# Patient Record
Sex: Female | Born: 1975 | Race: Black or African American | Hispanic: No | Smoking: Current every day smoker
Health system: Southern US, Community
[De-identification: ages and names within clinical notes are randomized; demographics above are authoritative.]

## PROBLEM LIST (undated history)

## (undated) DIAGNOSIS — I499 Cardiac arrhythmia, unspecified: Secondary | ICD-10-CM

## (undated) DIAGNOSIS — G4730 Sleep apnea, unspecified: Secondary | ICD-10-CM

## (undated) DIAGNOSIS — K219 Gastro-esophageal reflux disease without esophagitis: Secondary | ICD-10-CM

## (undated) DIAGNOSIS — Z9109 Other allergy status, other than to drugs and biological substances: Secondary | ICD-10-CM

## (undated) DIAGNOSIS — I10 Essential (primary) hypertension: Secondary | ICD-10-CM

## (undated) DIAGNOSIS — G43909 Migraine, unspecified, not intractable, without status migrainosus: Secondary | ICD-10-CM

## (undated) HISTORY — DX: Migraine, unspecified, not intractable, without status migrainosus: G43.909

## (undated) HISTORY — DX: Gastro-esophageal reflux disease without esophagitis: K21.9

## (undated) HISTORY — DX: Cardiac arrhythmia, unspecified: I49.9

## (undated) HISTORY — DX: Sleep apnea, unspecified: G47.30

## (undated) HISTORY — DX: Other allergy status, other than to drugs and biological substances: Z91.09

---

## 2003-09-25 DIAGNOSIS — K219 Gastro-esophageal reflux disease without esophagitis: Secondary | ICD-10-CM

## 2003-09-25 HISTORY — DX: Gastro-esophageal reflux disease without esophagitis: K21.9

## 2007-09-25 HISTORY — PX: TUBAL LIGATION: SHX77

## 2010-09-24 HISTORY — PX: THYROID SURGERY: SHX805

## 2017-09-24 DIAGNOSIS — G4730 Sleep apnea, unspecified: Secondary | ICD-10-CM

## 2017-09-24 DIAGNOSIS — G43909 Migraine, unspecified, not intractable, without status migrainosus: Secondary | ICD-10-CM

## 2017-09-24 DIAGNOSIS — Z9109 Other allergy status, other than to drugs and biological substances: Secondary | ICD-10-CM

## 2017-09-24 DIAGNOSIS — I499 Cardiac arrhythmia, unspecified: Secondary | ICD-10-CM

## 2017-09-24 HISTORY — DX: Sleep apnea, unspecified: G47.30

## 2017-09-24 HISTORY — DX: Cardiac arrhythmia, unspecified: I49.9

## 2017-09-24 HISTORY — DX: Other allergy status, other than to drugs and biological substances: Z91.09

## 2017-09-24 HISTORY — DX: Migraine, unspecified, not intractable, without status migrainosus: G43.909

## 2017-11-06 ENCOUNTER — Ambulatory Visit (INDEPENDENT_AMBULATORY_CARE_PROVIDER_SITE_OTHER): Payer: PRIVATE HEALTH INSURANCE

## 2017-11-06 ENCOUNTER — Ambulatory Visit (HOSPITAL_COMMUNITY)
Admission: EM | Admit: 2017-11-06 | Discharge: 2017-11-06 | Disposition: A | Discharge status: Home / Self Care | Payer: PRIVATE HEALTH INSURANCE | Attending: Family Medicine | Admitting: Family Medicine

## 2017-11-06 ENCOUNTER — Encounter (HOSPITAL_COMMUNITY): Payer: Self-pay | Admitting: Emergency Medicine

## 2017-11-06 ENCOUNTER — Other Ambulatory Visit: Payer: Self-pay

## 2017-11-06 DIAGNOSIS — M791 Myalgia, unspecified site: Secondary | ICD-10-CM | POA: Diagnosis not present

## 2017-11-06 DIAGNOSIS — Z79899 Other long term (current) drug therapy: Secondary | ICD-10-CM | POA: Insufficient documentation

## 2017-11-06 DIAGNOSIS — I517 Cardiomegaly: Secondary | ICD-10-CM | POA: Diagnosis not present

## 2017-11-06 DIAGNOSIS — R05 Cough: Secondary | ICD-10-CM

## 2017-11-06 DIAGNOSIS — I10 Essential (primary) hypertension: Secondary | ICD-10-CM | POA: Insufficient documentation

## 2017-11-06 DIAGNOSIS — F17200 Nicotine dependence, unspecified, uncomplicated: Secondary | ICD-10-CM | POA: Diagnosis not present

## 2017-11-06 DIAGNOSIS — R51 Headache: Secondary | ICD-10-CM

## 2017-11-06 DIAGNOSIS — J111 Influenza due to unidentified influenza virus with other respiratory manifestations: Secondary | ICD-10-CM | POA: Insufficient documentation

## 2017-11-06 DIAGNOSIS — R6883 Chills (without fever): Secondary | ICD-10-CM

## 2017-11-06 DIAGNOSIS — J029 Acute pharyngitis, unspecified: Secondary | ICD-10-CM | POA: Diagnosis not present

## 2017-11-06 DIAGNOSIS — R69 Illness, unspecified: Secondary | ICD-10-CM

## 2017-11-06 HISTORY — DX: Essential (primary) hypertension: I10

## 2017-11-06 LAB — POCT RAPID STREP A: Streptococcus, Group A Screen (Direct): NEGATIVE

## 2017-11-06 MED ORDER — OSELTAMIVIR PHOSPHATE 75 MG PO CAPS: 75.0000 mg | ORAL_CAPSULE | Freq: Two times a day (BID) | ORAL | 0 refills | Status: AC

## 2017-11-06 MED ORDER — BENZONATATE 200 MG PO CAPS: 200.0000 mg | ORAL_CAPSULE | Freq: Three times a day (TID) | ORAL | 0 refills | Status: AC

## 2017-11-06 MED ORDER — ONDANSETRON 4 MG PO TBDP: 4.0000 mg | ORAL_TABLET | Freq: Three times a day (TID) | ORAL | 0 refills | Status: DC | PRN

## 2017-11-06 NOTE — ED Provider Notes (Signed)
[facility]    CSN: [id number] Arrival date & time: 11/06/17  1146     History   Chief Complaint Chief Complaint  Patient presents with  . URI    HPI Anne Gonzalez is a 42 y.o. female history of hypertension, presenting today with acute onset of cough, body aches, headache, chills.  Symptoms all began yesterday.  She took a Tylenol cold and flu last night.  She denies nausea, vomiting, abdominal pain.  She does state that her stomach does feel a little off as if she needs to have diarrhea.  Patient has approximately 14-year pack year history.  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  Not have a flu shot this year.  Patient does not have a primary care provider, she is not on any medicines for her blood pressure.  She recently moved to the area and has not gotten established yet.  States she just recently got insurance with work.  She denies headache being the worst headache of life, states she has she does feel like she has had more difficulty seeing at night, but no acute changes.  Denies any chest pain or shortness of breath.  HPI  Past Medical History:  Diagnosis Date  . Hypertension     There are no active problems to display for this patient.   Past Surgical History:  Procedure Laterality Date  . THYROID SURGERY    . TUBAL LIGATION      OB History    No data available       Home Medications    Prior to Admission medications   Medication Sig Start Date End Date Taking? Authorizing Provider  benzonatate (TESSALON) 200 MG capsule Take 1 capsule (200 mg total) by mouth every 8 (eight) hours for 7 days. 11/06/17 11/13/17  Wieters, Hallie C, PA-C  ondansetron (ZOFRAN ODT) 4 MG disintegrating tablet Take 1 tablet (4 mg total) by mouth every 8 (eight) hours as needed for nausea or vomiting. 11/06/17   Wieters, Hallie C, PA-C  oseltamivir (TAMIFLU) 75 MG capsule Take 1 capsule (75 mg total) by mouth 2 (two) times daily for  5 days. 11/06/17 11/11/17  Wieters, Junius CreamerHallie C, PA-C    Family History Family History  Problem Relation Age of Onset  . Diabetes Father   . Hypertension Father   . Heart failure Father     Social History Social History   Tobacco Use  . Smoking status: Current Every Day Smoker  Substance Use Topics  . Alcohol use: Yes  . Drug use: No     Allergies   Lisinopril   Review of Systems Review of Systems  Constitutional: Negative for chills, fatigue and fever.  HENT: Positive for congestion, rhinorrhea, sinus pressure and sore throat. Negative for ear pain and trouble swallowing.   Eyes: Positive for visual disturbance.  Respiratory: Positive for cough. Negative for chest tightness and shortness of breath.   Cardiovascular: Negative for chest pain.  Gastrointestinal: Negative for abdominal pain, nausea and vomiting.  Musculoskeletal: Negative for myalgias.  Skin: Negative for rash.  Neurological: Positive for headaches. Negative for dizziness and light-headedness.     Physical Exam Triage Vital Signs ED Triage Vitals  Enc Vitals Group     BP 11/06/17 1302 (!) 182/104     Pulse Rate 11/06/17 1302 87     Resp 11/06/17 1302 (!) 22     Temp 11/06/17 1302 100.2 F (37.9 C)  Temp Source 11/06/17 1302 Oral     SpO2 11/06/17 1302 100 %     Weight --      Height --      Head Circumference --      Peak Flow --      Pain Score 11/06/17 1258 4     Pain Loc --      Pain Edu? --      Excl. in GC? --    No data found.  Updated Vital Signs BP (!) 178/105 (BP Location: Left Arm)   Pulse 87   Temp 100.2 F (37.9 C) (Oral)   Resp (!) 22   LMP 10/31/2017 (Exact Date)   SpO2 100%   Visual Acuity Right Eye Distance:   Left Eye Distance:   Bilateral Distance:    Right Eye Near:   Left Eye Near:    Bilateral Near:     Physical Exam  Constitutional: She is oriented to person, place, and time. She appears well-developed and well-nourished. No distress.  Patient lying  under a blanket in room.  HENT:  Head: Normocephalic and atraumatic.  Bilateral TMs nonerythematous, nasal mucosa and turbinates erythematous with rhinorrhea present, posterior oropharynx erythematous, no exudate or tonsillar enlargement.  Eyes: Conjunctivae and EOM are normal. Pupils are equal, round, and reactive to light.  Neck: Neck supple.  Cardiovascular: Normal rate and regular rhythm.  No murmur heard. Pulmonary/Chest: Effort normal and breath sounds normal. No respiratory distress.  Breathing comfortably at rest, mild rhonchi auscultated in right upper lung field, clear to auscultation without adventitious sounds in remaining lung fields.  Abdominal: Soft. There is no tenderness.  Musculoskeletal: She exhibits no edema.  Neurological: She is alert and oriented to person, place, and time.  Skin: Skin is warm and dry.  Psychiatric: She has a normal mood and affect.  Nursing note and vitals reviewed.    UC Treatments / Results  Labs (all labs ordered are listed, but only abnormal results are displayed) Labs Reviewed  CULTURE, GROUP A STREP [facility])  POCT RAPID STREP A    EKG  EKG Interpretation None       Radiology Dg Chest 2 View  Result Date: 11/06/2017 CLINICAL DATA:  Intermittent cough for 2 days. EXAM: CHEST  2 VIEW COMPARISON:  None. FINDINGS: Lungs are clear. There is cardiomegaly. No pneumothorax or pleural effusion. No acute bony abnormality. IMPRESSION: Cardiomegaly without acute disease. Electronically Signed   By: Drusilla Kanner M.D.   On: 11/06/2017 13:38    Procedures Procedures (including critical care time)  Medications Ordered in UC Medications - No data to display   Initial Impression / Assessment and Plan / UC Course  I have reviewed the triage vital signs and the nursing notes.  Pertinent labs & imaging results that were available during my care of the patient were reviewed by me and considered in my medical decision making (see chart for  details).     Patient with symptoms concerning for influenza, obtain chest x-ray given mild fever, history of smoking and mild rhonchi.  Chest x-ray negative for pneumonia.  Will treat for influenza.  Patient within 48 hours of Tamiflu window, will give Tamiflu and Zofran.  Recommend further over-the-counter symptom management.  Tessalon for cough. Discussed strict return precautions. Patient verbalized understanding and is agreeable with plan.   Final Clinical Impressions(s) / UC Diagnoses   Final diagnoses:  Influenza-like illness    ED Discharge Orders        Ordered  oseltamivir (TAMIFLU) 75 MG capsule  2 times daily     11/06/17 1351    ondansetron (ZOFRAN ODT) 4 MG disintegrating tablet  Every 8 hours PRN     11/06/17 1351    benzonatate (TESSALON) 200 MG capsule  Every 8 hours     11/06/17 1351       Controlled Substance Prescriptions [place] Controlled Substance Registry consulted? Not Applicable   Lew Dawes, [place] 11/06/17 1409

## 2017-11-06 NOTE — Discharge Instructions (Signed)
You appear to have the flu.  Please take Tamiflu twice daily for the next 5 days.  He may use Zofran as needed for nausea, and to help with your appetite.  1. Take a daily allergy pill/anti-histamine like Zyrtec, Claritin, or Store brand consistently for 2 weeks  2. For congestion you may try an oral decongestant like Mucinex or sudafed. You may also try intranasal flonase nasal spray or saline irrigations (neti pot, sinus cleanse)  3. For your sore throat you may try cepacol lozenges, salt water gargles, throat spray. Treatment of congestion may also help your sore throat.  4. For cough you may try Tessalon that I have prescribed, or you may try over-the-counter Delsym or Robitussin.  5. Take Tylenol or Ibuprofen to help with pain/inflammation-please alternate every 4 hours to control fever, headache  6. Stay hydrated, drink plenty of fluids to keep throat coated and less irritated  Honey Tea For cough/sore throat try using a honey-based tea. Use 3 teaspoons of honey with juice squeezed from half lemon. Place shaved pieces of ginger into 1/2-1 cup of water and warm over stove top. Then mix the ingredients and repeat every 4 hours as needed.  Please continue to monitor your blood pressure at home, if you develop any severe headache, changes in vision, one-sided weakness, difficulty speaking, chest pain or shortness of breath please go to the emergency room.

## 2017-11-06 NOTE — ED Triage Notes (Signed)
Onset yesterday of symptoms.  Coughing started yesterday.  Today, she feels terrible: coughing, sneezing, generalized pain.  Patient works at a nursing home.

## 2017-11-08 LAB — CULTURE, GROUP A STREP (THRC)

## 2017-11-18 ENCOUNTER — Encounter (HOSPITAL_COMMUNITY): Payer: Self-pay

## 2017-11-18 ENCOUNTER — Ambulatory Visit (HOSPITAL_COMMUNITY)
Admission: EM | Admit: 2017-11-18 | Discharge: 2017-11-18 | Disposition: A | Discharge status: Home / Self Care | Payer: PRIVATE HEALTH INSURANCE | Attending: Family Medicine | Admitting: Family Medicine

## 2017-11-18 DIAGNOSIS — M25561 Pain in right knee: Secondary | ICD-10-CM | POA: Diagnosis not present

## 2017-11-18 DIAGNOSIS — S161XXA Strain of muscle, fascia and tendon at neck level, initial encounter: Secondary | ICD-10-CM | POA: Diagnosis not present

## 2017-11-18 DIAGNOSIS — S46912A Strain of unspecified muscle, fascia and tendon at shoulder and upper arm level, left arm, initial encounter: Secondary | ICD-10-CM | POA: Diagnosis not present

## 2017-11-18 DIAGNOSIS — M25562 Pain in left knee: Secondary | ICD-10-CM

## 2017-11-18 DIAGNOSIS — I10 Essential (primary) hypertension: Secondary | ICD-10-CM

## 2017-11-18 MED ORDER — NAPROXEN 500 MG PO TABS: 500.0000 mg | ORAL_TABLET | Freq: Two times a day (BID) | ORAL | 0 refills | Status: DC | PRN

## 2017-11-18 MED ORDER — CYCLOBENZAPRINE HCL 10 MG PO TABS: ORAL_TABLET | ORAL | 0 refills | Status: DC
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
## 2017-11-18 NOTE — Discharge Instructions (Addendum)
Recommend start Naproxen 500mg  twice a day as directed for pain. May take Flexeril 10mg  1/2 to 1 whole tablet every 8 hours as needed for muscle spasms/pain. Apply warm compresses to area for comfort. Follow-up in 3 to 4 days if not improving.
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
## 2018-03-17 ENCOUNTER — Encounter: Payer: Self-pay | Admitting: Family Medicine

## 2018-04-10 ENCOUNTER — Encounter: Payer: Self-pay | Admitting: Family Medicine

## 2018-04-10 NOTE — Progress Notes (Signed)
Subjective:   Patient ID: Anne Gonzalez    DOB: 18-Sep-1976, 42 y.o. female   MRN: [id number]  Anne GrossMichelle Lynn Fickel is a 42 y.o. female here for   Establish Care - PMFSH and medications reviewed and updated in EMR - HTN - no current meds. Previously on losartan, HCTZ, lisinopril, carvedilol. Had cough on lisinopril but tolerated losartan. Was previously on statin also.  - h/o migraine - woke up last Friday with headache. Took tylenol PM. Used to take imitrex 50mg  in the past (~2016) but didn't like the way it made her feel. Sometimes still lingered on 50mg . Thought 100mg  was too strong. Hasn't had as many since she left her husband.  - allergies - pineapple, hay fever, pollen. No chronic meds, claritin PRN - irregular heart beat - was told when had thyroid surgery. Denies palpitations, SOB, CP. - sleep apnea - noticed with weight gain, previously with CPAP. No issues currently. - sometimes will get boils in vaginal area and under arms - h/o postpartum depression, previously on zoloft with good effect. Not currently on antidepressants. - Surgical Hx - BTL 2009, thyroid surgery 2012 (never been on meds) - Reproductive Hx - [id number], vaginal deliveries. Gestational Diabetes with last 2 pregnancies. BP fine during pregnancies. 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 Smokes cigarettes. Former marijuana use. Occasional alcohol use.  FATIGUE Patient complains of fatigue for the past few years. The Tiredness is described as low energy Feels like doing activities but can't: yes Does not feel like doing things: no Feels the fatigue is due to: unsure PHQ2 negative. Endorses 2 menstrual cycles per month for the past 3 months with associated low energy but has been feeling fatigued prior to menorrhagia.  Symptoms Fever: no Sweating at night: no Weight Loss: no Shortness of Breath: no Coughing up Blood: no Muscle Pain or Weakness: some back pain Black or bloody Stools: no Severe Snoring or  Daytime Sleepiness: yes. Does drink energy drinks to get through the day Feeling Down: no. Loves to cook and is like therapy to her Not enjoying things: no Rash: no Leg or Joint Swelling: no Chest Pain or irregular heart beat:  H/o irregular heart beat, denies CP  Healthcare Maintenance - Pap Smear: due  Review of Systems:  Per HPI.  PMFSH, medications and smoking status reviewed.  Objective:   BP (!) 145/90   Pulse 76   Temp 98.9 F (37.2 C) (Oral)   Ht 5' 3.5" (1.613 m)   Wt 177 lb (80.3 kg)   LMP 04/10/2018 Comment: twice a month x 3 months  SpO2 98%   BMI 30.86 kg/m  Vitals and nursing note reviewed.  General: obese female, in no acute distress with non-toxic appearance HEENT: normocephalic, atraumatic, moist mucous membranes Neck: supple, non-tender without lymphadenopathy CV: regular rate and rhythm without murmurs, rubs, or gallops, no lower extremity edema Lungs: clear to auscultation bilaterally with normal work of breathing Abdomen: soft, non-tender, no masses or organomegaly palpable, normoactive bowel sounds Extremities: warm and well perfused, normal tone MSK: ROM grossly intact, strength intact, gait normal Neuro: Alert and oriented, speech normal  Assessment & Plan:   Fatigue Likely multifactorial. Describes menorrhagia for the past few months as well as a history of sleep apnea and hypothyroidism without CPAP or synthroid use recently so anemia, sleep apnea, and hypothyroidism are likely contributors. Also, with recent major life change, stress is also likely a factor however PHQ2 negative so less concern at this moment for depression.  Will obtain CBC, TFTs, Vit B12 and Vit D levels.   Essential hypertension Endorses previous h/o HTN however without medication use recently. Previous ED visit in 10/2017 noted 180s SBP. 130s SBP today, 145/90 on recheck. Patient declines medication intervention at this time but agrees to [facility] and follow up in one week for  likely medication initiation.  Healthcare Maintenance Encouraged patient to return for pap smear. Obtained baseline labs today including A1c. Referral made to dentistry.  Orders Placed This Encounter  Procedures  . TSH  . CBC with Differential  . Vitamin B12  . T3, Free  . T4, Free  . Vitamin D, 25-hydroxy  . HIV antibody (with reflex)  . Basic Metabolic Panel  . Ambulatory referral to Dentistry    Referral Priority:   Routine    Referral Type:   Consultation    Referral Reason:   Specialty Services Required    Requested Specialty:   Dental General Practice    Number of Visits Requested:   1  . HgB A1c   No orders of the defined types were placed in this encounter.   Ellwood Dense, DO PGY-2, [facility] 04/14/2018 1:40 PM

## 2018-04-11 ENCOUNTER — Encounter: Payer: Self-pay | Admitting: Family Medicine

## 2018-04-11 ENCOUNTER — Other Ambulatory Visit: Payer: Self-pay

## 2018-04-11 ENCOUNTER — Ambulatory Visit: Payer: No Typology Code available for payment source | Admitting: Family Medicine

## 2018-04-11 VITALS — BP 145/90 | HR 76 | Temp 98.9°F | Ht 63.5 in | Wt 177.0 lb

## 2018-04-11 DIAGNOSIS — R5383 Other fatigue: Secondary | ICD-10-CM | POA: Insufficient documentation

## 2018-04-11 DIAGNOSIS — I10 Essential (primary) hypertension: Secondary | ICD-10-CM

## 2018-04-11 DIAGNOSIS — Z7689 Persons encountering health services in other specified circumstances: Secondary | ICD-10-CM

## 2018-04-11 LAB — POCT GLYCOSYLATED HEMOGLOBIN (HGB A1C): Hemoglobin A1C: 4.7 % (ref 4.0–5.6)

## 2018-04-11 NOTE — Patient Instructions (Signed)
It was great to see you!  Our plans for today:  - We are checking some labs today, we will call you with these results. - Please make an appointment for your pap smear. At that time, we can take a closer look at your boils and prescribe antibiotics if necessary. - We made a referral to a dentist.  Take care and seek immediate care sooner if you develop any concerns.   Dr. Mollie Germanyumball [facility]

## 2018-04-11 NOTE — Assessment & Plan Note (Addendum)
Likely multifactorial. Describes menorrhagia for the past few months as well as a history of sleep apnea and hypothyroidism without CPAP or synthroid use recently so anemia, sleep apnea, and hypothyroidism are likely contributors. Also, with recent major life change, stress is also likely a factor however PHQ2 negative so less concern at this moment for depression. Will obtain CBC, TFTs, Vit B12 and Vit D levels.

## 2018-04-12 LAB — CBC WITH DIFFERENTIAL/PLATELET
Basophils Absolute: 0 10*3/uL (ref 0.0–0.2)
Basos: 0 %
EOS (ABSOLUTE): 0.2 10*3/uL (ref 0.0–0.4)
Eos: 2 %
Hematocrit: 39.8 % (ref 34.0–46.6)
Hemoglobin: 13.3 g/dL (ref 11.1–15.9)
Immature Grans (Abs): 0.1 10*3/uL (ref 0.0–0.1)
Immature Granulocytes: 1 %
Lymphocytes Absolute: 2.9 10*3/uL (ref 0.7–3.1)
Lymphs: 29 %
MCH: 31.5 pg (ref 26.6–33.0)
MCHC: 33.4 g/dL (ref 31.5–35.7)
MCV: 94 fL (ref 79–97)
Monocytes Absolute: 0.7 10*3/uL (ref 0.1–0.9)
Monocytes: 7 %
Neutrophils Absolute: 6.1 10*3/uL (ref 1.4–7.0)
Neutrophils: 61 %
Platelets: 378 10*3/uL (ref 150–450)
RBC: 4.22 x10E6/uL (ref 3.77–5.28)
RDW: 11.7 % — ABNORMAL LOW (ref 12.3–15.4)
WBC: 10.1 10*3/uL (ref 3.4–10.8)

## 2018-04-12 LAB — BASIC METABOLIC PANEL
BUN/Creatinine Ratio: 13 (ref 9–23)
BUN: 11 mg/dL (ref 6–24)
CO2: 22 mmol/L (ref 20–29)
Calcium: 9.5 mg/dL (ref 8.7–10.2)
Chloride: 104 mmol/L (ref 96–106)
Creatinine, Ser: 0.83 mg/dL (ref 0.57–1.00)
GFR calc Af Amer: 99 mL/min/{1.73_m2} (ref >59)
GFR calc non Af Amer: 86 mL/min/{1.73_m2} (ref >59)
Glucose: 91 mg/dL (ref 65–99)
Potassium: 4.5 mmol/L (ref 3.5–5.2)
Sodium: 141 mmol/L (ref 134–144)

## 2018-04-12 LAB — VITAMIN D 25 HYDROXY (VIT D DEFICIENCY, FRACTURES): Vit D, 25-Hydroxy: 9.2 ng/mL — ABNORMAL LOW (ref 30.0–100.0)

## 2018-04-12 LAB — T3, FREE: T3, Free: 2.8 pg/mL (ref 2.0–4.4)

## 2018-04-12 LAB — VITAMIN B12: Vitamin B-12: 393 pg/mL (ref 232–1245)

## 2018-04-12 LAB — TSH: TSH: 2.58 u[IU]/mL (ref 0.450–4.500)

## 2018-04-12 LAB — HIV ANTIBODY (ROUTINE TESTING W REFLEX): HIV Screen 4th Generation wRfx: NONREACTIVE

## 2018-04-12 LAB — T4, FREE: Free T4: 1.37 ng/dL (ref 0.82–1.77)

## 2018-04-14 ENCOUNTER — Encounter: Payer: Self-pay | Admitting: Family Medicine

## 2018-04-14 DIAGNOSIS — I10 Essential (primary) hypertension: Secondary | ICD-10-CM | POA: Insufficient documentation

## 2018-04-14 DIAGNOSIS — I1A0 Resistant hypertension: Secondary | ICD-10-CM | POA: Insufficient documentation

## 2018-04-14 NOTE — Assessment & Plan Note (Addendum)
Endorses previous h/o HTN however without medication use recently. Previous ED visit in 10/2017 noted 180s SBP. 130s SBP today, 145/90 on recheck. Patient declines medication intervention at this time [facility] up in one week for likely medication initiation.

## 2018-04-18 ENCOUNTER — Ambulatory Visit: Payer: No Typology Code available for payment source | Admitting: Family Medicine

## 2018-04-18 ENCOUNTER — Other Ambulatory Visit: Payer: Self-pay

## 2018-04-18 ENCOUNTER — Encounter: Payer: Self-pay | Admitting: Family Medicine

## 2018-04-18 DIAGNOSIS — I10 Essential (primary) hypertension: Secondary | ICD-10-CM

## 2018-04-18 DIAGNOSIS — R5382 Chronic fatigue, unspecified: Secondary | ICD-10-CM | POA: Diagnosis not present

## 2018-04-18 MED ORDER — LOSARTAN POTASSIUM 50 MG PO TABS: 50.0000 mg | ORAL_TABLET | Freq: Every day | ORAL | 0 refills | Status: DC

## 2018-04-18 NOTE — Assessment & Plan Note (Signed)
Fatigue is likely more accurately described as anhedonia caused by a depressive episode in the setting of multiple life stressors she is currently facing. Previous labs (TSH, CBC) are normal. Sleep apnea could be a possible contributor. Gave patient the information for our behavioral health team and told her to call them whenever she wants to talk with someone. Told patient we can further evaluate her possible depressive episode during next visit and see if she still feels this way after she has had some time to spend with her best friend, who will be living with her for a while. Patient does not have any suicidal ideation.  Will discuss the need for antidepressive medication on next visit.
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
## 2018-04-18 NOTE — Patient Instructions (Addendum)
It was nice talking to you today [name].  I wanted to sum up what we talked about below:   - We talked about your blood pressure.  It was elevated again today, so we are going to start you on Losartan 50mg  daily.    - We will follow up in 1 to 2 weeks to see if how you respond to this medication.    - We talked about the stress you are dealing with in your life currently including your relationship with you husband, moving to a new place, and your stress at work and how this has affected your will to do things.  We will talk about this again when you come next time to see if any of this has changed since your friend has come down to stay with you. If it hasn't, we can talk about medication options to help.    - We discussed our behavioral health services that are available here.  You can contact them any time to schedule an appointment with them at: [phone].    - We discussed that during your next visit we can perform a Pap smear if you would like.    - We discussed getting a Tdap vaccination today if you would like.    Thank you for letting me take care of you today.    Frederic Jerichoan Trent Gabler, MD

## 2018-04-18 NOTE — Assessment & Plan Note (Signed)
Patient was 164/86 today in clinic.  Patient unsure of all the HTN meds she was on previously.  Knows lisinopril makes her cough and thinks amlodipine may have made her legs swell.  Will start her on losartan 50 qdaily and titrate as necessary.  Patient asked to return in 2 weeks for followup.

## 2018-05-06 ENCOUNTER — Encounter: Payer: Self-pay | Admitting: Family Medicine

## 2018-05-06 ENCOUNTER — Ambulatory Visit (INDEPENDENT_AMBULATORY_CARE_PROVIDER_SITE_OTHER): Payer: No Typology Code available for payment source | Admitting: Family Medicine

## 2018-05-06 ENCOUNTER — Other Ambulatory Visit: Payer: Self-pay

## 2018-05-06 VITALS — BP 152/90 | HR 81 | Temp 98.1°F | Ht 64.0 in | Wt 178.0 lb

## 2018-05-06 DIAGNOSIS — I10 Essential (primary) hypertension: Secondary | ICD-10-CM | POA: Diagnosis not present

## 2018-05-06 MED ORDER — LOSARTAN POTASSIUM 50 MG PO TABS: 50.0000 mg | ORAL_TABLET | Freq: Every day | ORAL | 0 refills | Status: DC

## 2018-05-06 MED ORDER — HYDROCHLOROTHIAZIDE 25 MG PO TABS: 25.0000 mg | ORAL_TABLET | Freq: Every day | ORAL | 3 refills | Status: DC

## 2018-05-06 NOTE — Assessment & Plan Note (Addendum)
Patient BP today is 152/90 and still elevated despite initiation of losartan 2 weeks ago. Patient is currently on 50 mg daily. Patient was concerned about fluid retention and had trace edema in her lower extremities. Patient could benefit from an Echo and seem to have had one in the past. She has a history of uncontrolled HTN for many and could have some degree of structural change to her heart and possible CHF. Will start patient on thiazide today , she will follow up with PCP in early September. If still not at goal could increase losartan to a 100 mg daily. Patient has failed amlodipine in the past because of swelling. --Start patient on Amlodipine 25 mg daily --Follow up with PCP in 2-3 weeks

## 2018-05-06 NOTE — Progress Notes (Signed)
   Subjective:    Patient ID: Ollen GrossMichelle Lynn Matlack, female    DOB: [birth year], 42 y.o.   MRN: [id number]   CC: Blood pressure follow up   HPI: Patient is a 42 yo female who presents today to follow up on elevated BP. Patient was started on losartan two weeks ago. She reports that blood pressure reading are are still elevated SBP in the 160's and DBP in the 90-100's. She is concerned she is retaining fluid. Patient reports that she had and ECHO while se was still living in [place]. Patient denies any shortness of breath, HA, chest pain, vision changes, tinnitus.   Smoking status reviewed   ROS: all other systems were reviewed and are negative other than in the HPI   Past Medical History:  Diagnosis Date  . Acid reflux 2005  . Environmental allergies 2019  . Hypertension   . Irregular heart beat 2019  . Migraine 2019  . Sleep apnea 2019    Past Surgical History:  Procedure Laterality Date  . THYROID SURGERY  2012  . TUBAL LIGATION  2009    Past medical history, surgical, family, and social history reviewed and updated in the EMR as appropriate.  Objective:  BP (!) 152/90   Pulse 81   Temp 98.1 F (36.7 C) (Oral)   Ht 5\' 4"  (1.626 m)   Wt 178 lb (80.7 kg)   LMP 04/10/2018 Comment: twice a month x 3 months  BMI 30.55 kg/m   Vitals and nursing note reviewed  General: NAD, pleasant, able to participate in exam Cardiac: RRR, normal heart sounds, no murmurs. 2+ radial and PT pulses bilaterally Respiratory: CTAB, normal effort, No wheezes, rales or rhonchi Abdomen: soft, nontender, nondistended, no hepatic or splenomegaly, +BS Extremities: no edema or cyanosis. WWP. Skin: warm and dry, no rashes noted Neuro: alert and oriented x4, no focal deficits Psych: Normal affect and mood   Assessment & Plan:    Essential hypertension Patient BP today is 152/90 and still elevated despite initiation of losartan 2 weeks ago. Patient is currently on 50 mg daily. Patient was  concerned about fluid retention and had trace edema in her lower extremities. Patient could benefit from an Echo and seem to have had one in the past. She has a history of uncontrolled HTN for many and could have some degree of structural change to her heart and possible CHF. Will start patient on thiazide today , she will follow up with PCP in early September. If still not at goal could increase losartan to a 100 mg daily. Patient has failed amlodipine in the past because of swelling. --Start patient on Amlodipine 25 mg daily --Follow up with PCP in 2-3 weeks    Lovena NeighboursAbdoulaye Chrystopher Stangl, MD [facility] PGY-3

## 2018-05-06 NOTE — Patient Instructions (Signed)
It was great seeing you today! We have addressed the following issues today  1. I am starting you on HCTZ 25 mg, continue to check you BP and discuss it with Dr.Rumball when see her on 8/16.  If we did any lab work today, and the results require attention, either me or my nurse will get in touch with you. If everything is normal, you will get a letter in mail and a message via . If you don't hear from us in two weeks, please give us a call. Otherwise, we look forward to seeing you again at your next visit. If you have any questions or concerns before then, please call the clinic at [phone].  Please bring all your medications to every doctors visit  Sign up for My Chart to have easy access to your labs results, and communication with your Primary care physician. Please ask Front Desk for some assistance.   Please check-out at the front desk before leaving the clinic.    Take Care,   Dr. Sydnee Cabaliallo

## 2018-05-30 ENCOUNTER — Encounter: Payer: No Typology Code available for payment source | Admitting: Family Medicine

## 2018-08-26 ENCOUNTER — Encounter (HOSPITAL_COMMUNITY): Payer: Self-pay | Admitting: Emergency Medicine

## 2018-08-26 ENCOUNTER — Ambulatory Visit (HOSPITAL_COMMUNITY)
Admission: EM | Admit: 2018-08-26 | Discharge: 2018-08-26 | Disposition: A | Discharge status: Home / Self Care | Payer: BLUE CROSS/BLUE SHIELD | Attending: Family Medicine | Admitting: Family Medicine

## 2018-08-26 DIAGNOSIS — T25222A Burn of second degree of left foot, initial encounter: Secondary | ICD-10-CM

## 2018-08-26 MED ORDER — MELOXICAM 7.5 MG PO TABS: 7.5000 mg | ORAL_TABLET | Freq: Every day | ORAL | 0 refills | Status: DC
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
## 2018-08-26 NOTE — Discharge Instructions (Signed)
No alarming signs on exam. Start Mobic. Do not take ibuprofen (motrin/advil)/ naproxen (aleve) while on mobic.  Ice compress, avoid friction, wearing postop boot.  Avoid tight shoes.  This should slowly go down and start healing within the next week or 2.  Follow-up your with PCP for further evaluation if symptoms not improving.  Follow-up for reevaluation if showing signs of infection such as redness, warmth, fever.
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
## 2018-10-15 NOTE — Progress Notes (Signed)
Subjective:   Patient ID: Anne Gonzalez    DOB: 03-Apr-1976, 43 y.o. female   MRN: [id number]  Anne Gonzalez is a 43 y.o. female with a history of HTN here for   Pap smear - last PAP years ago, unsure if normal. - denies abnormal discharge or bleeding - LMP 10/09/18 - BTL 2009, no other form of contraception - not sexually active, last sex 4 years  Skin rash Endorses circular dark patches on her skin located under her R breast, arm, and abdomen that have been there for about 4 years. She first noticed it when putting on lotion. She was living in [place] at the time. When they first occurred, they were red, irritated, swollen, and warm and now will occasionally "open up and then scab over." She denies any discharge. She denies any known bug, tick, or animal exposure prior to noticing it. She denies N/V, bowel movement changes, joint swelling or pain, CP, SOB. She does have a history of boils, but these look different from boils. They are currently not bothering her. She is concerned because she has mentioned these to several other doctors and have been told not to worry about it.  Hypertension: - Medications: losartan 50mg , HCTZ 25mg  daily - Compliance: stopped taking HCTZ 2 weeks after prescribed, had headaches. - had previously been on amlodipine but BP dropped too low (about 5 years ago) - Checking BP at home: went to dentist this am 181/82. Taking at home, sometimes as high as 200s SBP. 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 - Denies any SOB, CP, LE edema, medication SEs, or symptoms of hypotension - endorsing some blurry vision for the past year. Especially noticeable at night. Is due to see the eye doctor.  Review of Systems:  Per HPI.  PMFSH, medications and smoking status reviewed.  Objective:   BP (!) 154/92   Pulse 80   Temp 98.8 F (37.1 C) (Oral)   Ht 5\' 4"  (1.626 m)   Wt 180 lb 8 oz (81.9 kg)    LMP 10/09/2018   SpO2 97%   BMI 30.98 kg/m  Vitals and nursing note reviewed.  General: overweight female, in no acute distress with non-toxic appearance CV: regular rate and rhythm without murmurs, rubs, or gallops, no lower extremity edema Lungs: clear to auscultation bilaterally with normal work of breathing Skin: warm, dry. 3 noticeable dark circular patches located on R arm, under R breast, and L lower abdomen a few cm in diameter (See pictures). Extremities: warm and well perfused, normal tone MSK: ROM grossly intact, strength intact, gait normal Neuro: Alert and oriented, speech normal         Assessment & Plan:   Essential hypertension Above goal today with losartan. Will start low dose amlodipine with instructed to take BP daily and call for highs/lows (see AVS for parameters). Will obtain BMP today. F/u in 3 months if BP ok at home.  Rash and nonspecific skin eruption Unclear etiology. No meds identified to be causing drug eruption. No known animal/bug exposure. Could be hypersensitivity reaction of unknown origin. Instructed patient to take pictures of when it "opens up" to better classify. Offered hydrocortisone cream to help with hyperpigmentation, patient will obtain OTC.   Healthcare Maintenance Pap smear obtained today, will call/send letter with results.   Orders Placed This Encounter  Procedures  . Basic Metabolic Panel   Meds ordered  this encounter  Medications  . amLODipine (NORVASC) 5 MG tablet    Sig: Take 1 tablet (5 mg total) by mouth daily.    Dispense:  90 tablet    Refill:  0    Ellwood Dense, DO PGY-2, [facility] 10/17/2018 11:40 AM

## 2018-10-16 ENCOUNTER — Encounter: Payer: Self-pay | Admitting: Family Medicine

## 2018-10-16 ENCOUNTER — Other Ambulatory Visit: Payer: Self-pay

## 2018-10-16 ENCOUNTER — Ambulatory Visit (INDEPENDENT_AMBULATORY_CARE_PROVIDER_SITE_OTHER): Payer: BLUE CROSS/BLUE SHIELD | Admitting: Family Medicine

## 2018-10-16 ENCOUNTER — Other Ambulatory Visit (HOSPITAL_COMMUNITY)
Admission: RE | Admit: 2018-10-16 | Discharge: 2018-10-16 | Disposition: A | Discharge status: Home / Self Care | Payer: BLUE CROSS/BLUE SHIELD | Source: Ambulatory Visit | Attending: Family Medicine | Admitting: Family Medicine

## 2018-10-16 VITALS — BP 154/92 | HR 80 | Temp 98.8°F | Ht 64.0 in | Wt 180.5 lb

## 2018-10-16 DIAGNOSIS — Z124 Encounter for screening for malignant neoplasm of cervix: Secondary | ICD-10-CM

## 2018-10-16 DIAGNOSIS — I10 Essential (primary) hypertension: Secondary | ICD-10-CM

## 2018-10-16 DIAGNOSIS — R21 Rash and other nonspecific skin eruption: Secondary | ICD-10-CM

## 2018-10-16 MED ORDER — AMLODIPINE BESYLATE 5 MG PO TABS: 5.0000 mg | ORAL_TABLET | Freq: Every day | ORAL | 0 refills | Status: DC

## 2018-10-16 NOTE — Patient Instructions (Signed)
It was great to see you!  Our plans for today:  - We did your pap smear today, we will call you or send you a letter with these results. - We are starting a new medication for your blood pressure. Be sure to continue to take your blood pressure daily and let us know if you have any high >160 for the top number and <110 for the top number. - We are checking your kidney function today as well.  Take care and seek immediate care sooner if you develop any concerns.   Dr. [facility]

## 2018-10-17 ENCOUNTER — Encounter: Payer: Self-pay | Admitting: Family Medicine

## 2018-10-17 DIAGNOSIS — L989 Disorder of the skin and subcutaneous tissue, unspecified: Secondary | ICD-10-CM | POA: Insufficient documentation

## 2018-10-17 DIAGNOSIS — R21 Rash and other nonspecific skin eruption: Secondary | ICD-10-CM | POA: Insufficient documentation

## 2018-10-17 LAB — BASIC METABOLIC PANEL
BUN/Creatinine Ratio: 11 (ref 9–23)
BUN: 11 mg/dL (ref 6–24)
CO2: 22 mmol/L (ref 20–29)
Calcium: 9.7 mg/dL (ref 8.7–10.2)
Chloride: 102 mmol/L (ref 96–106)
Creatinine, Ser: 0.97 mg/dL (ref 0.57–1.00)
GFR calc Af Amer: 82 mL/min/{1.73_m2} (ref >59)
GFR calc non Af Amer: 71 mL/min/{1.73_m2} (ref >59)
Glucose: 109 mg/dL — ABNORMAL HIGH (ref 65–99)
Potassium: 4 mmol/L (ref 3.5–5.2)
Sodium: 141 mmol/L (ref 134–144)

## 2018-10-17 NOTE — Assessment & Plan Note (Signed)
Unclear etiology. No meds identified to be causing drug eruption. No known animal/bug exposure. Could be hypersensitivity reaction of unknown origin. Instructed patient to take pictures of when it "opens up" to better classify. Offered hydrocortisone cream to help with hyperpigmentation, patient will obtain OTC.

## 2018-10-17 NOTE — Assessment & Plan Note (Signed)
Above goal today with losartan. Will start low dose amlodipine with instructed to take BP daily and call for highs/lows (see AVS for parameters). Will obtain BMP today. F/u in 3 months if BP ok at home.

## 2018-10-21 LAB — CYTOLOGY - PAP
Diagnosis: NEGATIVE
HPV: NOT DETECTED

## 2018-10-22 ENCOUNTER — Telehealth: Payer: Self-pay

## 2018-10-22 NOTE — Telephone Encounter (Signed)
Pt contacted and informed of normal labs and pap smear.

## 2018-10-22 NOTE — Telephone Encounter (Signed)
-----   Message from Oralia Manis, DO sent at 10/22/2018  8:52 AM EST ----- Please inform patient that results are negative.

## 2019-02-04 ENCOUNTER — Ambulatory Visit (INDEPENDENT_AMBULATORY_CARE_PROVIDER_SITE_OTHER): Payer: No Typology Code available for payment source

## 2019-02-04 ENCOUNTER — Other Ambulatory Visit: Payer: Self-pay

## 2019-02-04 DIAGNOSIS — Z111 Encounter for screening for respiratory tuberculosis: Secondary | ICD-10-CM | POA: Diagnosis not present

## 2019-02-04 NOTE — Progress Notes (Signed)
Patient presents in nurse clinic for PPD test. PPD applied to right forearm without complications. Pt to return to nurse clinic on 5/15 around 10a to have site read. Reminder card was given.

## 2019-02-06 ENCOUNTER — Ambulatory Visit (INDEPENDENT_AMBULATORY_CARE_PROVIDER_SITE_OTHER): Payer: No Typology Code available for payment source | Admitting: *Deleted

## 2019-02-06 ENCOUNTER — Other Ambulatory Visit: Payer: Self-pay

## 2019-02-06 DIAGNOSIS — Z111 Encounter for screening for respiratory tuberculosis: Secondary | ICD-10-CM

## 2019-02-06 LAB — TB SKIN TEST
Induration: 0 mm
TB Skin Test: NEGATIVE

## 2019-02-06 NOTE — Progress Notes (Signed)
Patient is here for a PPD read.  It was placed on 02/04/19 in the right forearm @ 930 am.    PPD RESULTS:  Result: Negative Induration: 0 mm  Letter created and given to patient for documentation purposes. Jone Baseman, CMA

## 2019-05-12 IMAGING — DX DG CHEST 2V
2 series · 2 of 2 positions shown · non-contrast
Comparison: None.

CLINICAL DATA: Intermittent cough for 2 days.

EXAM:
CHEST  2 VIEW

[chest pa]
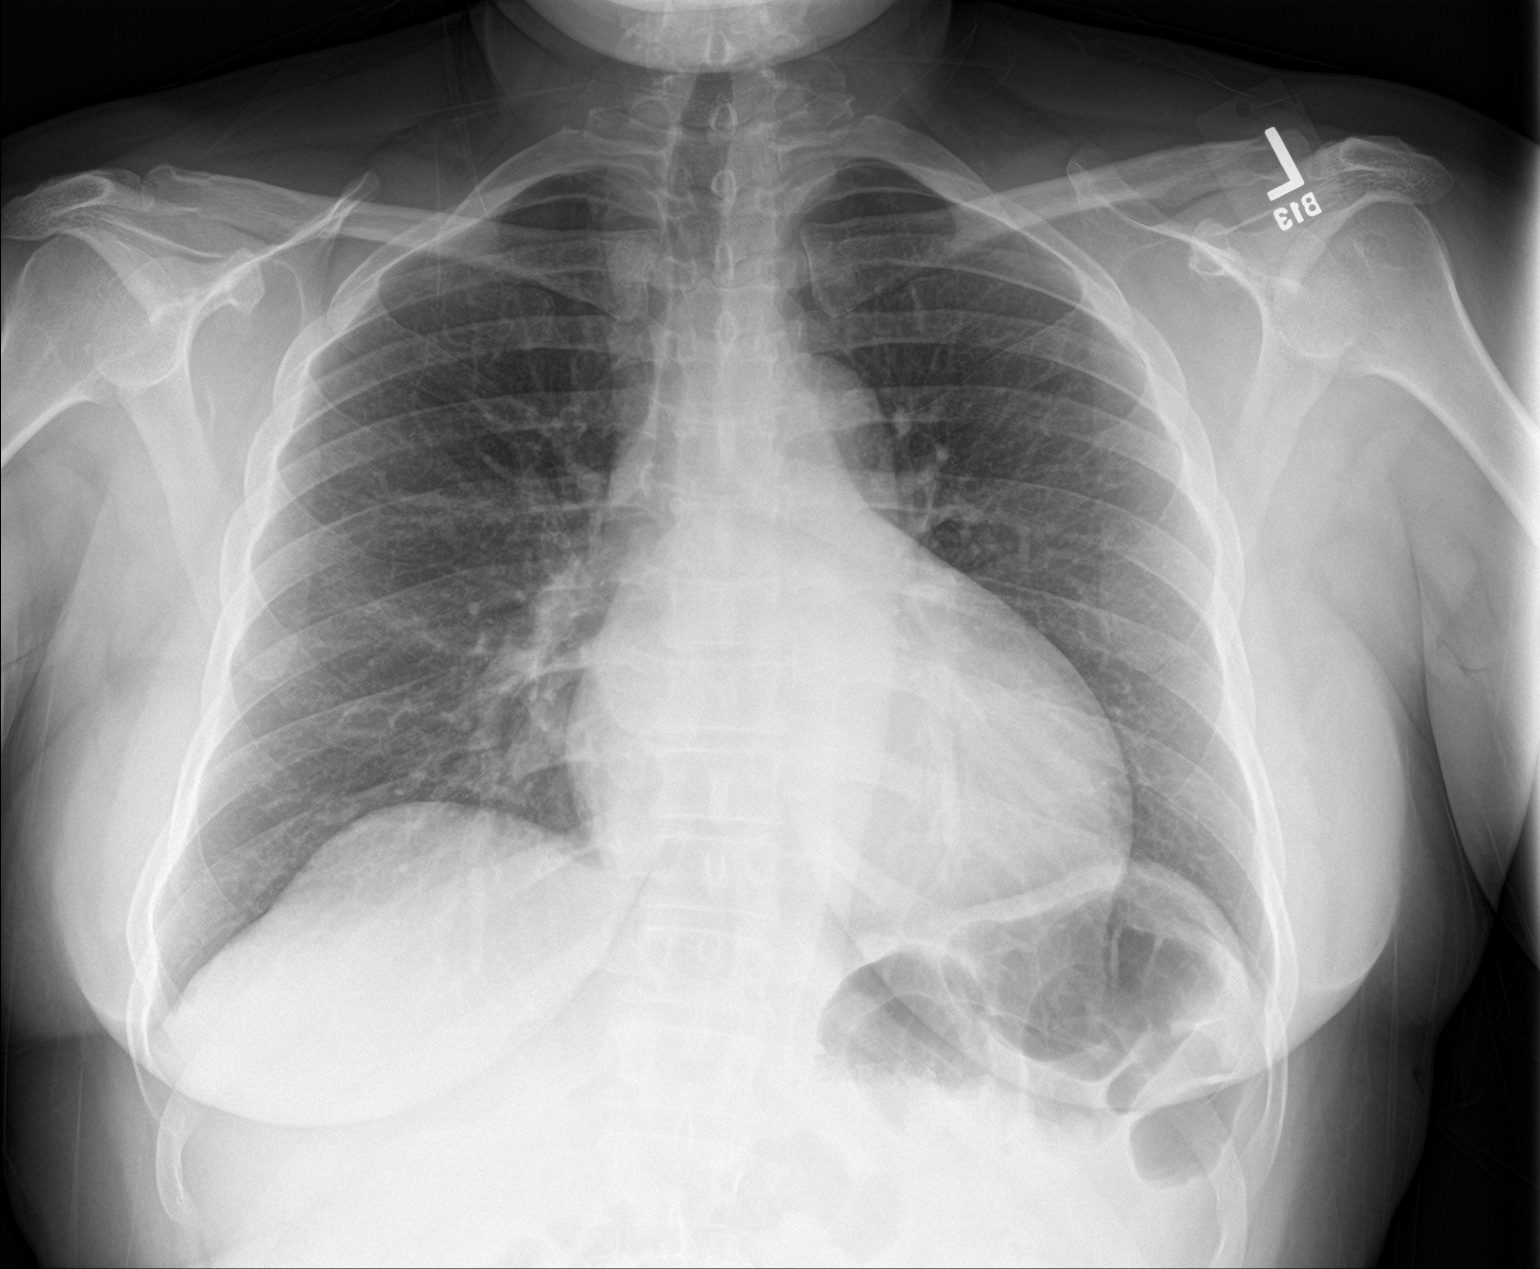

[chest lat]
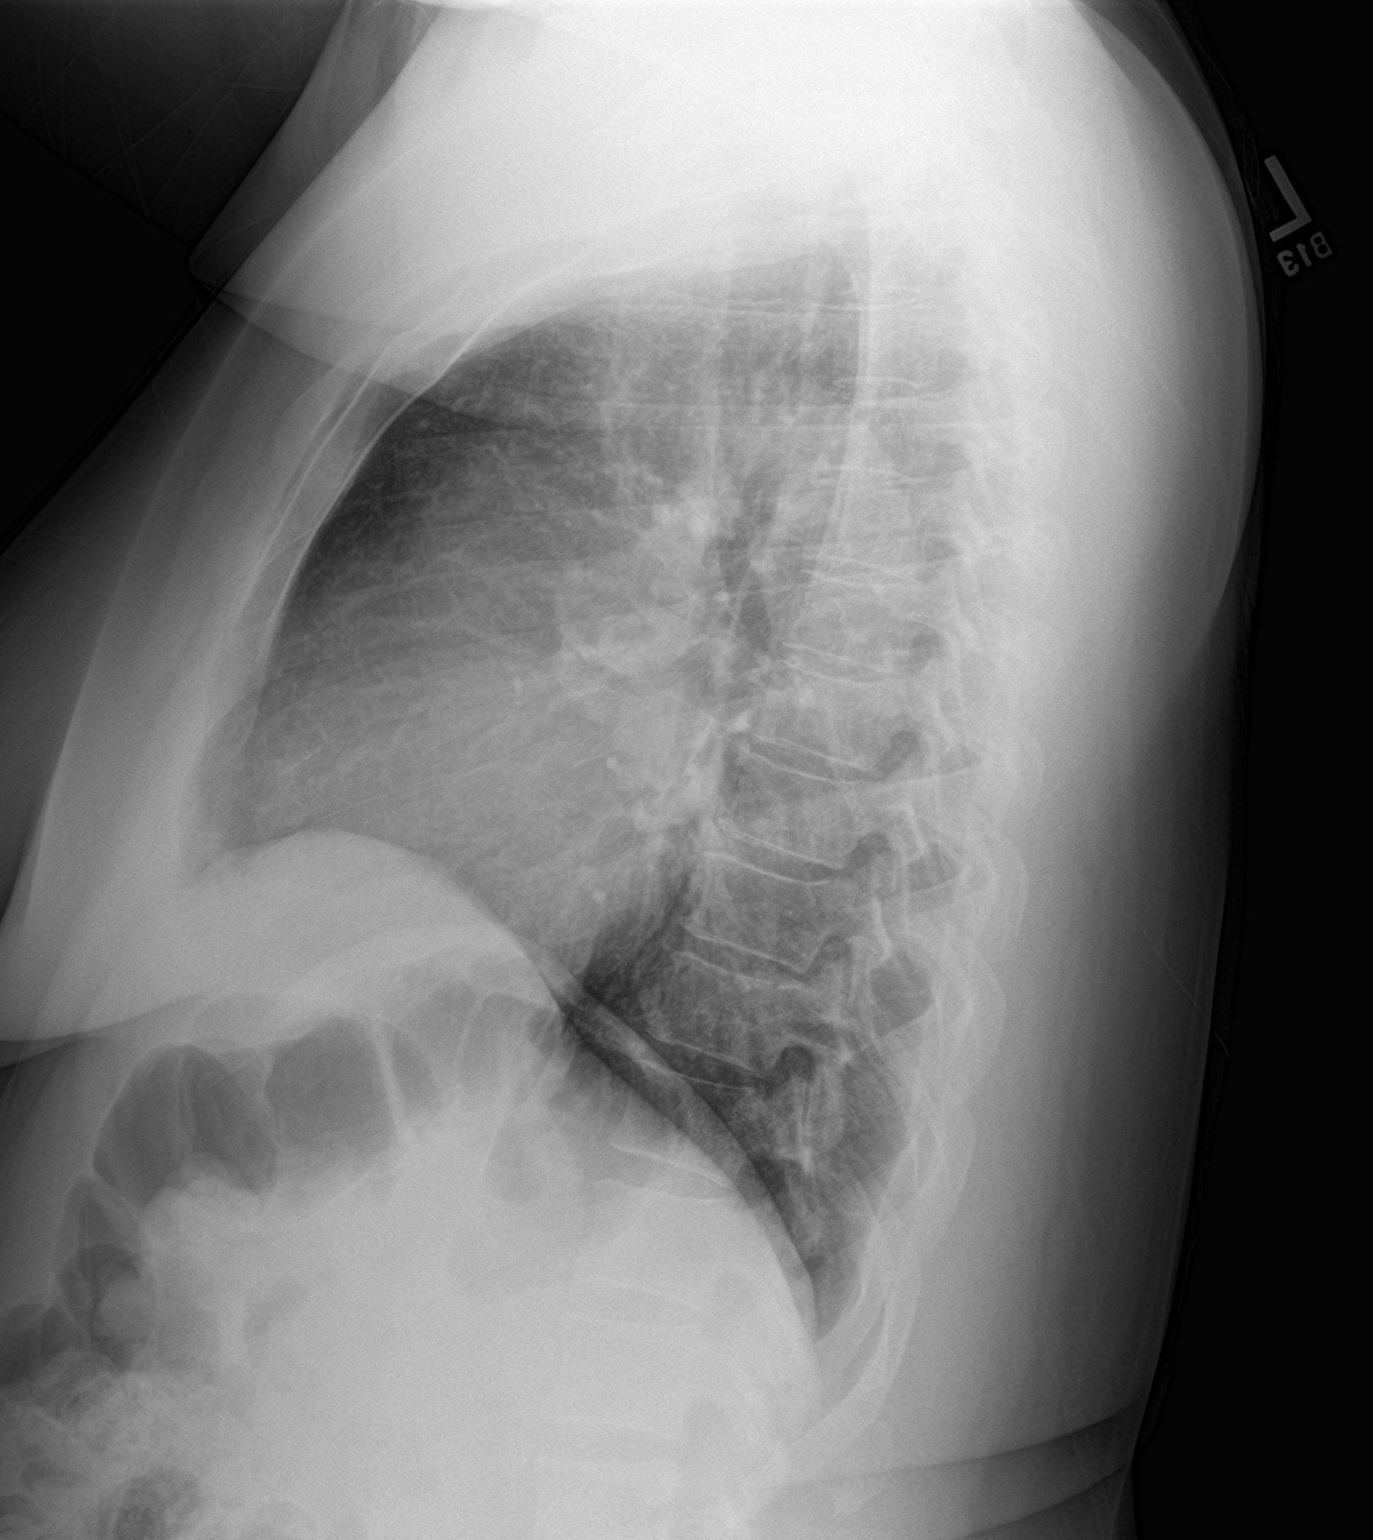

[2 of 2 positions shown; findings below may reference images not displayed]

FINDINGS: Lungs are clear. There is cardiomegaly. No pneumothorax or pleural
effusion. No acute bony abnormality.
IMPRESSION: Cardiomegaly without acute disease.

## 2019-09-23 ENCOUNTER — Other Ambulatory Visit: Payer: Self-pay

## 2019-09-23 DIAGNOSIS — Z20822 Contact with and (suspected) exposure to covid-19: Secondary | ICD-10-CM

## 2019-09-24 LAB — NOVEL CORONAVIRUS, NAA: SARS-CoV-2, NAA: NOT DETECTED

## 2019-10-09 ENCOUNTER — Encounter (HOSPITAL_COMMUNITY): Payer: Self-pay | Admitting: Emergency Medicine

## 2019-10-09 ENCOUNTER — Ambulatory Visit (HOSPITAL_COMMUNITY)
Admission: EM | Admit: 2019-10-09 | Discharge: 2019-10-09 | Disposition: A | Discharge status: Home / Self Care | Payer: No Typology Code available for payment source

## 2019-10-09 ENCOUNTER — Emergency Department (HOSPITAL_COMMUNITY)
Admission: EM | Admit: 2019-10-09 | Discharge: 2019-10-09 | Disposition: A | Discharge status: Home / Self Care | Payer: Self-pay | Attending: Emergency Medicine | Admitting: Emergency Medicine

## 2019-10-09 ENCOUNTER — Encounter (HOSPITAL_COMMUNITY): Payer: Self-pay

## 2019-10-09 ENCOUNTER — Other Ambulatory Visit: Payer: Self-pay

## 2019-10-09 ENCOUNTER — Emergency Department (HOSPITAL_COMMUNITY): Payer: Self-pay

## 2019-10-09 DIAGNOSIS — R519 Headache, unspecified: Secondary | ICD-10-CM | POA: Insufficient documentation

## 2019-10-09 DIAGNOSIS — I10 Essential (primary) hypertension: Secondary | ICD-10-CM | POA: Insufficient documentation

## 2019-10-09 DIAGNOSIS — Z5321 Procedure and treatment not carried out due to patient leaving prior to being seen by health care provider: Secondary | ICD-10-CM | POA: Insufficient documentation

## 2019-10-09 LAB — BASIC METABOLIC PANEL
Anion gap: 10 (ref 5–15)
BUN: 10 mg/dL (ref 6–20)
CO2: 24 mmol/L (ref 22–32)
Calcium: 9.2 mg/dL (ref 8.9–10.3)
Chloride: 105 mmol/L (ref 98–111)
Creatinine, Ser: 0.75 mg/dL (ref 0.44–1.00)
GFR calc Af Amer: >60 mL/min (ref >60)
GFR calc non Af Amer: >60 mL/min (ref >60)
Glucose, Bld: 99 mg/dL (ref 70–99)
Potassium: 3.7 mmol/L (ref 3.5–5.1)
Sodium: 139 mmol/L (ref 135–145)

## 2019-10-09 LAB — CBC
HCT: 42.6 % (ref 36.0–46.0)
Hemoglobin: 13.6 g/dL (ref 12.0–15.0)
MCH: 31.6 pg (ref 26.0–34.0)
MCHC: 31.9 g/dL (ref 30.0–36.0)
MCV: 98.8 fL (ref 80.0–100.0)
Platelets: 320 10*3/uL (ref 150–400)
RBC: 4.31 MIL/uL (ref 3.87–5.11)
RDW: 12.3 % (ref 11.5–15.5)
WBC: 10.4 10*3/uL (ref 4.0–10.5)
nRBC: 0 % (ref 0.0–0.2)

## 2019-10-09 MED ORDER — ACETAMINOPHEN 325 MG PO TABS
650.0000 mg | ORAL_TABLET | Freq: Once | ORAL | Status: AC
  Administered 2019-10-09: 650 mg via ORAL
  Filled 2019-10-09: qty 2

## 2019-10-09 NOTE — ED Triage Notes (Signed)
Patient sent for hypertension and headache. Patient states she has had history of hypertension and was unable to get it controlled with medication and then has been off any BP medicine for about a year. C/o regular headaches and today has pain behind both eyes. Equal grip strength and sensation noted.

## 2019-10-09 NOTE — ED Notes (Signed)
This Tech updated pt on wait status. Pt stated, "I know all my lab work is in and I've waited here long enough so I'm leaving." Pt advised to return if symptoms worsen. Pt left AMA.

## 2019-10-09 NOTE — ED Triage Notes (Signed)
Pt present elevated blood pressure, symptoms started a few days ago. Pt was being treated for this but when she relocated to [place] she had stopped

## 2019-10-09 NOTE — ED Notes (Signed)
Explained to patient that I alerted the triage RN of her blood pressure. Patient states "I know how this works I am a Engineer, civil (consulting). im just venting that this is ridiculous

## 2019-10-09 NOTE — ED Notes (Signed)
Patient is being discharged from the Urgent Care Center and sent to the Emergency Department via wheelchair by staff. Per natalie, patient is stable but in need of higher level of care due to HTN. Patient is aware and verbalizes understanding of plan of care.  Vitals:   10/09/19 1710  BP: (!) 229/123  Pulse: 72  Resp: 16  Temp: 98.9 F (37.2 C)  SpO2: 100%

## 2019-10-09 NOTE — ED Notes (Signed)
Patient states 'I just need a room so I can get my results." after obtaining vitals pt states "my blood pressure is going back up now im getting f*ing pissed I need my medication." triage rn notified.

## 2019-10-20 ENCOUNTER — Ambulatory Visit (INDEPENDENT_AMBULATORY_CARE_PROVIDER_SITE_OTHER): Payer: Self-pay | Admitting: Family Medicine

## 2019-10-20 ENCOUNTER — Encounter: Payer: Self-pay | Admitting: Family Medicine

## 2019-10-20 ENCOUNTER — Other Ambulatory Visit: Payer: Self-pay

## 2019-10-20 VITALS — BP 182/90 | HR 78

## 2019-10-20 DIAGNOSIS — L732 Hidradenitis suppurativa: Secondary | ICD-10-CM

## 2019-10-20 DIAGNOSIS — I10 Essential (primary) hypertension: Secondary | ICD-10-CM

## 2019-10-20 DIAGNOSIS — F17200 Nicotine dependence, unspecified, uncomplicated: Secondary | ICD-10-CM

## 2019-10-20 MED ORDER — LOSARTAN POTASSIUM 50 MG PO TABS: 75.0000 mg | ORAL_TABLET | Freq: Every day | ORAL | 2 refills | Status: DC

## 2019-10-20 MED ORDER — SULFAMETHOXAZOLE-TRIMETHOPRIM 800-160 MG PO TABS: 1.0000 | ORAL_TABLET | Freq: Two times a day (BID) | ORAL | 0 refills | Status: DC

## 2019-10-20 NOTE — Assessment & Plan Note (Signed)
Present in bilateral underams. Will offer patient bactrim given prior success in the past. Patient will be receiving insurance in the next few months and interested in pursuing surgical referral in the future.

## 2019-10-20 NOTE — Assessment & Plan Note (Signed)
Current every day smoker. Motivated to quit. Previously tried chantix and patches separately but not together. Will trial stack therapy. Gave 1-800-QUIT-NOW hotline number, instructed to receive patches through them. Will reach out to pharmacy to help patient afford chantix given lack of insurance.

## 2019-10-20 NOTE — Patient Instructions (Signed)
It was great to see you!  Our plans for today:  - Call the 1-800-QUIT-NOW line to get nicotine replacement patches and free counseling on tobacco cessation, they are available 24/7. - I sent a message to our pharmacist about seeing how we can help you afford Chantix. - Take the antibiotic for your hidradenitis. - Come back in 1 month for follow-up and lab work.  Take care and seek immediate care sooner if you develop any concerns.   Dr. [facility]

## 2019-10-20 NOTE — Assessment & Plan Note (Signed)
Uncontrolled. Will restart losartan at higher dose. F/u in one month for BP check and BMP, lipid panel.

## 2019-10-20 NOTE — Progress Notes (Signed)
  Subjective:   Patient ID: Anne Gonzalez    DOB: [birth year], 44 y.o. female   MRN: [id number]  Anne Gonzalez is a 44 y.o. female with a history of HTN here for   Hypertension: - recently seen at UC due to headache with SBP 220s. CT Head in ED without acute finding. Left ED AMA due to long wait time. BMP/CBC at that time wnl.  - Medications: amlodipine 5mg  daily, losartan 50mg  daily - Compliance: not taking anything for the past year. Had swelling with HCTZ, amlodipine. Didn't feel losartan was effective so she stopped taking it. - Checking BP at home: yes, SBP 200/100s - Denies any SOB, LE edema, or symptoms of hypotension - Sometimes will get a little chest pain. Reports ongoing worsening in vision and is now wearing glasses. Does get migraines with aura. Is having swelling of legs occasionally that goes down with elevation.  - Tobacco use: 0.5ppd for 30 years. Has tried chantix, patches. Has not tried stack therapy. - had dry cough with lisinopril.    Hidradenitis - ongoing hidradenitis for years, worsened in the last week. Previously was draining but stopped about a week ago. Reports pain, swelling. - denies fevers. - occasionally gets in genital area. Currently in bilateral underarms.  - has tried many antibiotics (doxycycline, clindamycin) reports only bactrim has worked for her in the past.   Review of Systems:  Per HPI.  Medications and smoking status reviewed.  Objective:   BP (!) 182/90   Pulse 78   LMP 10/09/2019   SpO2 98%  Vitals and nursing note reviewed.  General: well nourished, well developed, in no acute distress with non-toxic appearance CV: regular rate and rhythm without murmurs, rubs, or gallops, no lower extremity edema Lungs: clear to auscultation bilaterally with normal work of breathing Skin: warm, dry. Several areas of induration tender to palpation in bilateral underarms without redness, active draining.   Assessment & Plan:    Essential hypertension Uncontrolled. Will restart losartan at higher dose. F/u in one month for BP check and BMP, lipid panel.   Hidradenitis Present in bilateral underams. Will offer patient bactrim given prior success in the past. Patient will be receiving insurance in the next few months and interested in pursuing surgical referral in the future.   Tobacco use disorder Current every day smoker. Motivated to quit. Previously tried chantix and patches separately but not together. Will trial stack therapy. Gave 1-800-QUIT-NOW hotline number, instructed to receive patches through them. Will reach out to pharmacy to help patient afford chantix given lack of insurance.   No orders of the defined types were placed in this encounter.  Meds ordered this encounter  Medications  . losartan (COZAAR) 50 MG tablet    Sig: Take 1.5 tablets (75 mg total) by mouth daily.    Dispense:  45 tablet    Refill:  2  . sulfamethoxazole-trimethoprim (BACTRIM DS) 800-160 MG tablet    Sig: Take 1 tablet by mouth 2 (two) times daily for 5 days.    Dispense:  10 tablet    Refill:  0    , DO PGY-3, [facility] 10/20/2019 8:15 PM

## 2019-11-03 ENCOUNTER — Ambulatory Visit: Payer: Self-pay | Admitting: Pharmacist

## 2019-11-03 DIAGNOSIS — F17200 Nicotine dependence, unspecified, uncomplicated: Secondary | ICD-10-CM

## 2019-11-13 NOTE — Progress Notes (Signed)
  Chronic Care Management   Outreach Note  11/03/2019 Name: Anne Gonzalez MRN: [id number] DOB: [birth year]  Referred by: Ellwood Dense, DO Reason for referral : Chronic Care Management   An unsuccessful telephone outreach was attempted today. The patient was referred to the case management team for assistance with care management and care coordination.   Follow Up Plan: A HIPPA compliant phone message was left for the patient providing contact information and requesting a return call.  The care management team will reach out to the patient again over the next 7-10 days.   SIGNATURE Kieth Brightly, PharmD, BCPS Clinical Pharmacist, [facility] [place]  [facility]  Direct Dial: [phone]

## 2019-11-19 ENCOUNTER — Telehealth: Payer: Self-pay

## 2019-11-19 ENCOUNTER — Other Ambulatory Visit: Payer: Self-pay

## 2019-11-26 ENCOUNTER — Encounter: Payer: Self-pay | Admitting: Family Medicine

## 2019-11-26 ENCOUNTER — Ambulatory Visit (INDEPENDENT_AMBULATORY_CARE_PROVIDER_SITE_OTHER): Payer: Self-pay | Admitting: Family Medicine

## 2019-11-26 ENCOUNTER — Other Ambulatory Visit: Payer: Self-pay

## 2019-11-26 VITALS — BP 170/90 | HR 82 | Ht 64.0 in | Wt 165.5 lb

## 2019-11-26 DIAGNOSIS — I10 Essential (primary) hypertension: Secondary | ICD-10-CM

## 2019-11-26 DIAGNOSIS — R12 Heartburn: Secondary | ICD-10-CM

## 2019-11-26 MED ORDER — LOSARTAN POTASSIUM 100 MG PO TABS: 100.0000 mg | ORAL_TABLET | Freq: Every day | ORAL | 1 refills | Status: DC

## 2019-11-26 MED ORDER — OMEPRAZOLE 20 MG PO CPDR: 20.0000 mg | DELAYED_RELEASE_CAPSULE | Freq: Every day | ORAL | 0 refills | Status: DC

## 2019-11-26 NOTE — Patient Instructions (Addendum)
It was great to see you!  Our plans for today:  - We are starting a medication to help your heartburn. Take this about 30 minutes before breakfast. You can continue to take tums as needed.  - Keep a food diary and document when you notice symptoms.  - We are also referring you to the GI specialist for possible endoscopy.  - I am going to put in a referral for our social worker to help you get set up with financial assistance for this. - Follow up in one month. - I sent a refill of your losartan to the pharmacy.  We are checking some labs today, we will call you or send you a letter if they are abnormal.   Take care and seek immediate care sooner if you develop any concerns.   Dr. [facility]

## 2019-11-26 NOTE — Progress Notes (Signed)
   SUBJECTIVE:   Hypertension: - Medications: losartan 75mg  daily - Compliance: was taking 100mg  b/c she noticed higher readings on 75mg . Has been out of meds for 4 days. - Checking BP at home: yes, 140-150 range - Denies any SOB, CP, vision changes, LE edema, medication SEs, or symptoms of hypotension  Heartburn Reports central heartburn sensation that has been intermittent and present for some time. No known precipitating or relieving factors. Has previously tried ranitidine, Mylanta, tums without much relief.  Minimal exercise but does not notice any worsening of her heartburn with activity.  Denies difficulties breathing, nausea, vomiting, difficulty swallowing, blood in stool, dark tarry stools.  Has noticed unintentional weight loss, 15 pounds in the last year.  Does currently smoke for the past 30 years, 0.5 ppd.  Denies any known immediate FH of cancer.  PERTINENT  PMH / PSH: HTN, hidradenitis, tobacco use  OBJECTIVE:   BP (!) 170/90 Comment: 2nd attempt Lt arm  Pulse 82   Ht 5\' 4"  (1.626 m)   Wt 165 lb 8 oz (75.1 kg)   LMP 11/06/2019   SpO2 99%   BMI 28.41 kg/m   General: Overweight female, in no acute distress with non-toxic appearance CV: regular rate, no lower extremity edema Lungs: normal work of breathing Skin: warm, dry, no rashes or lesions  ASSESSMENT/PLAN:   Essential hypertension At goal with increased dose of losartan and without side effects. Does have h/o side effects with HCTZ, dry cough with ACEI. Will refill losartan at increased dose and check BMP, lipid panel today. F/u in one month.  Heartburn May be related to GERD however given randomness of timing of symptoms and presence of red flags including smoking history and unintentional weight loss, will refer to GI for EGD. Will also start PPI and have patient keep food diary to assess for precipitating or triggering factors. Referral placed to CCM for financial assistance given lack of insurance. Advised  patient to apply for orange card today.   , DO [facility]

## 2019-11-27 ENCOUNTER — Ambulatory Visit: Payer: Self-pay | Admitting: Licensed Clinical Social Worker

## 2019-11-27 DIAGNOSIS — R12 Heartburn: Secondary | ICD-10-CM | POA: Insufficient documentation

## 2019-11-27 LAB — LIPID PANEL
Chol/HDL Ratio: 3.7 ratio (ref 0.0–4.4)
Cholesterol, Total: 235 mg/dL — ABNORMAL HIGH (ref 100–199)
HDL: 63 mg/dL (ref >39)
LDL Chol Calc (NIH): 138 mg/dL — ABNORMAL HIGH (ref 0–99)
Triglycerides: 190 mg/dL — ABNORMAL HIGH (ref 0–149)
VLDL Cholesterol Cal: 34 mg/dL (ref 5–40)

## 2019-11-27 LAB — BASIC METABOLIC PANEL
BUN/Creatinine Ratio: 11 (ref 9–23)
BUN: 11 mg/dL (ref 6–24)
CO2: 24 mmol/L (ref 20–29)
Calcium: 9.4 mg/dL (ref 8.7–10.2)
Chloride: 105 mmol/L (ref 96–106)
Creatinine, Ser: 0.97 mg/dL (ref 0.57–1.00)
GFR calc Af Amer: 82 mL/min/{1.73_m2} (ref >59)
GFR calc non Af Amer: 71 mL/min/{1.73_m2} (ref >59)
Glucose: 84 mg/dL (ref 65–99)
Potassium: 3.8 mmol/L (ref 3.5–5.2)
Sodium: 141 mmol/L (ref 134–144)

## 2019-11-27 MED ORDER — ATORVASTATIN CALCIUM 10 MG PO TABS
10.0000 mg | ORAL_TABLET | Freq: Every day | ORAL | 3 refills | Status: DC
Start: 2019-11-27 — End: 2020-05-20

## 2019-11-27 NOTE — Addendum Note (Signed)
Addended by: Caro Laroche on: 11/27/2019 09:29 AM   Modules accepted: Orders

## 2019-11-27 NOTE — Chronic Care Management (AMB) (Signed)
   Clinical Social Work  Care Management referral   11/27/2019 Name: Anne Gonzalez MRN: [id number] DOB: 1975-11-11  Anne Gonzalez is a 44 y.o. year old female who is a primary care patient of Ellwood Dense, DO . LCSW was consulted by PCP due to not having insurance and needing to go to a specialist.   LCSW reached out to Ollen Gross today by phone to introduce self, assess needs and offer one time Care Management interventions.   Assessment: Patient works but does not have insurance due to working PRN. She has the [employer] and gathering all needed information to return it to the office. Patient also reports concerns with her tooth being chipped and dental options with the [facility].  SDOH (Social Determinants of Health) assessments performed: No:    Intervention: Explained [employer] is not insurance, explained the process, what is covered and what is needed for the application, discussed requirements of applying for the Affordable Care Theatre manager for [employer]. Explained the Medication Assistance Program (MAP) and provided phone number.    Review of patient status, including review of consultants reports, relevant laboratory and other test results, and collaboration with appropriate care team members and the patient's provider was performed as part of comprehensive patient evaluation and provision of care management services.    Plan:   1. Patient will call office if she has questions 2. No follow up required or needed by LCSW at this time  Sammuel Hines, LCSW Clinical Social Worker [facility] / [facility]   [phone] 3:37 PM

## 2019-11-27 NOTE — Assessment & Plan Note (Addendum)
May be related to GERD however given randomness of timing of symptoms and presence of red flags including smoking history and unintentional weight loss, will refer to GI for EGD. Will also start PPI and have patient keep food diary to assess for precipitating or triggering factors. Referral placed to CCM for financial assistance given lack of insurance. Advised patient to apply for orange card today.

## 2019-11-27 NOTE — Assessment & Plan Note (Signed)
At goal with increased dose of losartan and without side effects. Does have h/o side effects with HCTZ, dry cough with ACEI. Will refill losartan at increased dose and check BMP, lipid panel today. F/u in one month.

## 2020-01-04 ENCOUNTER — Other Ambulatory Visit: Payer: Self-pay

## 2020-01-04 ENCOUNTER — Ambulatory Visit: Payer: Self-pay

## 2020-01-04 NOTE — Chronic Care Management (AMB) (Signed)
  Care Management   Follow Up Note   01/04/2020 Name: Piera Downs MRN: [id number] DOB: Jul 23, 1976  Referred by: Ellwood Dense, DO Reason for referral : Care Coordination (Care Management RNCM F/U for Smoking Cessation )   Chiquita Heckert is a 44 y.o. year old female who is a primary care patient of Ellwood Dense, DO. The care management team was consulted for assistance with care management and care coordination needs.    Review of patient status, including review of consultants reports, relevant laboratory and other test results, and collaboration with appropriate care team members and the patient's provider was performed as part of comprehensive patient evaluation and provision of chronic care management services.  I reached out to Mrs. Roseanne Reno today as a follow-up for PharmD regarding smoking cessation.  Mrs. Kozel stated appreciation for today's call, but declined any further discussion regarding smoking cessation, stating that she is familiar with smoking cessation strategies and has not been successful with any of them.      SDOH (Social Determinants of Health) assessments performed: No See Care Plan activities for detailed interventions related to [facility])     Advanced Directives: See Care Plan and Vynca application for related entries.   Plan:  No further follow up required   Juanell Fairly RN, BSN, [facility] Care Management Coordinator [facility] Phone: [phone] Fax: [phone]

## 2020-04-29 ENCOUNTER — Encounter: Payer: Self-pay | Admitting: Family Medicine

## 2020-04-29 ENCOUNTER — Ambulatory Visit (INDEPENDENT_AMBULATORY_CARE_PROVIDER_SITE_OTHER): Payer: Self-pay | Admitting: Family Medicine

## 2020-04-29 VITALS — BP 140/98 | HR 91 | Ht 65.0 in | Wt 165.0 lb

## 2020-04-29 DIAGNOSIS — I10 Essential (primary) hypertension: Secondary | ICD-10-CM

## 2020-04-29 MED ORDER — VERAPAMIL HCL 40 MG PO TABS: 40.0000 mg | ORAL_TABLET | Freq: Three times a day (TID) | ORAL | 0 refills | Status: DC

## 2020-04-29 NOTE — Patient Instructions (Addendum)
High blood pressure:  Start taking verapamil 40 mg, you can start taking it twice a day and then increase to three times a day. If you have headache or any problems, please call our office [phone]. If you have a headache that does not improve and any changes to your vision, or problems with speaking or moving, please go to the emergency department.  Please follow up in 2 weeks, schedule that appointment at the front.  You can come do your labs whenever is convenient for you and the lab is open.  Thanks,  Dr. Leary Roca  DASH Eating Plan DASH stands for "Dietary Approaches to Stop Hypertension." The DASH eating plan is a healthy eating plan that has been shown to reduce high blood pressure (hypertension). It may also reduce your risk for type 2 diabetes, heart disease, and stroke. The DASH eating plan may also help with weight loss. What are tips for following this plan?  General guidelines  Avoid eating more than 2,300 mg (milligrams) of salt (sodium) a day. If you have hypertension, you may need to reduce your sodium intake to 1,500 mg a day.  Limit alcohol intake to no more than 1 drink a day for nonpregnant women and 2 drinks a day for men. One drink equals 12 oz of beer, 5 oz of wine, or 1 oz of hard liquor.  Work with your health care provider to maintain a healthy body weight or to lose weight. Ask what an ideal weight is for you.  Get at least 30 minutes of exercise that causes your heart to beat faster (aerobic exercise) most days of the week. Activities may include walking, swimming, or biking.  Work with your health care provider or diet and nutrition specialist (dietitian) to adjust your eating plan to your individual calorie needs. Reading food labels   Check food labels for the amount of sodium per serving. Choose foods with less than 5 percent of the Daily Value of sodium. Generally, foods with less than 300 mg of sodium per serving fit into this eating plan.  To  find whole grains, look for the word "whole" as the first word in the ingredient list. Shopping  Buy products labeled as "low-sodium" or "no salt added."  Buy fresh foods. Avoid canned foods and premade or frozen meals. Cooking  Avoid adding salt when cooking. Use salt-free seasonings or herbs instead of table salt or sea salt. Check with your health care provider or pharmacist before using salt substitutes.  Do not fry foods. Cook foods using healthy methods such as baking, boiling, grilling, and broiling instead.  Cook with heart-healthy oils, such as olive, canola, soybean, or sunflower oil. Meal planning  Eat a balanced diet that includes: ? 5 or more servings of fruits and vegetables each day. At each meal, try to fill half of your plate with fruits and vegetables. ? Up to 6-8 servings of whole grains each day. ? Less than 6 oz of lean meat, poultry, or fish each day. A 3-oz serving of meat is about the same size as a deck of cards. One egg equals 1 oz. ? 2 servings of low-fat dairy each day. ? A serving of nuts, seeds, or beans 5 times each week. ? Heart-healthy fats. Healthy fats called Omega-3 fatty acids are found in foods such as flaxseeds and coldwater fish, like sardines, salmon, and mackerel.  Limit how much you eat of the following: ? Canned or prepackaged foods. ? Food that is high in  trans fat, such as fried foods. ? Food that is high in saturated fat, such as fatty meat. ? Sweets, desserts, sugary drinks, and other foods with added sugar. ? Full-fat dairy products.  Do not salt foods before eating.  Try to eat at least 2 vegetarian meals each week.  Eat more home-cooked food and less restaurant, buffet, and fast food.  When eating at a restaurant, ask that your food be prepared with less salt or no salt, if possible. What foods are recommended? The items listed may not be a complete list. Talk with your dietitian about what dietary choices are best for  you. Grains Whole-grain or whole-wheat bread. Whole-grain or whole-wheat pasta. Brown rice. [place] Morrow. Bulgur. Whole-grain and low-sodium cereals. Pita bread. Low-fat, low-sodium crackers. Whole-wheat flour tortillas. Vegetables Fresh or frozen vegetables (raw, steamed, roasted, or grilled). Low-sodium or reduced-sodium tomato and vegetable juice. Low-sodium or reduced-sodium tomato sauce and tomato paste. Low-sodium or reduced-sodium canned vegetables. Fruits All fresh, dried, or frozen fruit. Canned fruit in natural juice (without added sugar). Meat and other protein foods Skinless chicken or [place]. Ground chicken or [place]. Pork with fat trimmed off. Fish and seafood. Egg whites. Dried beans, peas, or lentils. Unsalted nuts, nut butters, and seeds. Unsalted canned beans. Lean cuts of beef with fat trimmed off. Low-sodium, lean deli meat. Dairy Low-fat (1%) or fat-free (skim) milk. Fat-free, low-fat, or reduced-fat cheeses. Nonfat, low-sodium ricotta or cottage cheese. Low-fat or nonfat yogurt. Low-fat, low-sodium cheese. Fats and oils Soft margarine without trans fats. Vegetable oil. Low-fat, reduced-fat, or light mayonnaise and salad dressings (reduced-sodium). Canola, safflower, olive, soybean, and sunflower oils. Avocado. Seasoning and other foods Herbs. Spices. Seasoning mixes without salt. Unsalted popcorn and pretzels. Fat-free sweets. What foods are not recommended? The items listed may not be a complete list. Talk with your dietitian about what dietary choices are best for you. Grains Baked goods made with fat, such as croissants, muffins, or some breads. Dry pasta or rice meal packs. Vegetables Creamed or fried vegetables. Vegetables in a cheese sauce. Regular canned vegetables (not low-sodium or reduced-sodium). Regular canned tomato sauce and paste (not low-sodium or reduced-sodium). Regular tomato and vegetable juice (not low-sodium or reduced-sodium). Angie Fava.  Olives. Fruits Canned fruit in a light or heavy syrup. Fried fruit. Fruit in cream or butter sauce. Meat and other protein foods Fatty cuts of meat. Ribs. Fried meat. Berniece Salines. Sausage. Bologna and other processed lunch meats. Salami. Fatback. Hotdogs. Bratwurst. Salted nuts and seeds. Canned beans with added salt. Canned or smoked fish. Whole eggs or egg yolks. Chicken or [place] with skin. Dairy Whole or 2% milk, cream, and half-and-half. Whole or full-fat cream cheese. Whole-fat or sweetened yogurt. Full-fat cheese. Nondairy creamers. Whipped toppings. Processed cheese and cheese spreads. Fats and oils Butter. Stick margarine. Lard. Shortening. Ghee. Bacon fat. Tropical oils, such as coconut, palm kernel, or palm oil. Seasoning and other foods Salted popcorn and pretzels. Onion salt, garlic salt, seasoned salt, table salt, and sea salt. Worcestershire sauce. Tartar sauce. Barbecue sauce. Teriyaki sauce. Soy sauce, including reduced-sodium. Steak sauce. Canned and packaged gravies. Fish sauce. Oyster sauce. Cocktail sauce. Horseradish that you find on the shelf. Ketchup. Mustard. Meat flavorings and tenderizers. Bouillon cubes. Hot sauce and Tabasco sauce. Premade or packaged marinades. Premade or packaged taco seasonings. Relishes. Regular salad dressings. Where to find more information:  National Heart, Lung, and [place]: https://wilson-eaton.com/  American Heart Association: www.heart.org Summary  The DASH eating plan is a healthy eating plan that  has been shown to reduce high blood pressure (hypertension). It may also reduce your risk for type 2 diabetes, heart disease, and stroke.  With the DASH eating plan, you should limit salt (sodium) intake to 2,300 mg a day. If you have hypertension, you may need to reduce your sodium intake to 1,500 mg a day.  When on the DASH eating plan, aim to eat more fresh fruits and vegetables, whole grains, lean proteins, low-fat dairy, and heart-healthy  fats.  Work with your health care provider or diet and nutrition specialist (dietitian) to adjust your eating plan to your individual calorie needs. This information is not intended to replace advice given to you by your health care provider. Make sure you discuss any questions you have with your health care provider. Document Revised: 08/23/2017 Document Reviewed: 09/03/2016 Elsevier Patient Education  2020 [employer].

## 2020-04-30 ENCOUNTER — Encounter: Payer: Self-pay | Admitting: Family Medicine

## 2020-04-30 NOTE — Progress Notes (Signed)
    SUBJECTIVE:   CHIEF COMPLAINT / HPI: f/u HTN  HTN: - patient checks BP at home, regularly asymptomatic and high to 170s-180s systolic.  - She has dizziness when low to 140s, goes away when BP higher; no vision changes - reports compliance with losartan 100mg  qd, reports this has improved BP from before (SBP >200) - currently does not have insurance, has never been to nutritionist or tried DASH diet - Has used amlodipine in past, had HA's and LE edema, similar reported rxn w/ HCTZ  PERTINENT  PMH / PSH: HTN, HLD  OBJECTIVE:   BP (!) 140/98   Pulse 91   Ht 5\' 5"  (1.651 m)   Wt 165 lb (74.8 kg)   LMP 04/18/2020   SpO2 97%   BMI 27.46 kg/m   Physical Exam Vitals and nursing note reviewed.  Constitutional:      General: She is not in acute distress.    Appearance: Normal appearance. She is normal weight. She is not ill-appearing, toxic-appearing or diaphoretic.  HENT:     Head: Normocephalic and atraumatic.  Eyes:     Conjunctiva/sclera: Conjunctivae normal.  Cardiovascular:     Rate and Rhythm: Normal rate and regular rhythm.     Pulses: Normal pulses.     Heart sounds: Normal heart sounds.  Pulmonary:     Effort: Pulmonary effort is normal.     Breath sounds: Normal breath sounds.  Abdominal:     General: Bowel sounds are normal.  Skin:    General: Skin is warm and dry.  Neurological:     General: No focal deficit present.     Mental Status: She is alert. Mental status is at baseline.  Psychiatric:        Mood and Affect: Mood normal.        Behavior: Behavior normal.    ASSESSMENT/PLAN:   Essential hypertension - Medications: losartan 100 mg; start verapamil 40 mg TID, will increase weekly via calling; precepted with Dr. McDiarmid, due to her sensitivities to thiazides and dihydropyridine CCBs, will try non-dihydropyridine CCB - Compliance: good - Checking BP at home: yes, high - Denies any SOB, CP, vision changes, LE edema, medication SEs, or symptoms of  hypotension - Diet: standard american diet - Exercise: none - Will obtain BMP, patient left early so order changed to future so she can come in and do at her leisure; reviewed labs from March, WNL - Gave information on DASH diet, recommended to start exercise by walking 30 mins 3x per week; patient currently has no insurance, will qualify for nutritionist and will place referral when has insurance - follow up 2 weeks    06-27-2001, MD [facility]

## 2020-04-30 NOTE — Assessment & Plan Note (Addendum)
-   Medications: losartan 100 mg; start verapamil 40 mg TID, will increase weekly via calling; precepted with Dr. McDiarmid, due to her sensitivities to thiazides and dihydropyridine CCBs, will try non-dihydropyridine CCB - Compliance: good - Checking BP at home: yes, high - Denies any SOB, CP, vision changes, LE edema, medication SEs, or symptoms of hypotension - Diet: standard american diet - Exercise: none - Will obtain BMP, patient left early so order changed to future so she can come in and do at her leisure; reviewed labs from March, WNL - Gave information on DASH diet, recommended to start exercise by walking 30 mins 3x per week; patient currently has no insurance, will qualify for nutritionist and will place referral when has insurance - follow up 2 weeks

## 2020-05-02 ENCOUNTER — Other Ambulatory Visit: Payer: Self-pay | Admitting: Family Medicine

## 2020-05-09 ENCOUNTER — Other Ambulatory Visit: Payer: Self-pay

## 2020-05-09 DIAGNOSIS — I10 Essential (primary) hypertension: Secondary | ICD-10-CM

## 2020-05-10 LAB — BASIC METABOLIC PANEL
BUN/Creatinine Ratio: 14 (ref 9–23)
BUN: 12 mg/dL (ref 6–24)
CO2: 22 mmol/L (ref 20–29)
Calcium: 9.6 mg/dL (ref 8.7–10.2)
Chloride: 108 mmol/L — ABNORMAL HIGH (ref 96–106)
Creatinine, Ser: 0.86 mg/dL (ref 0.57–1.00)
GFR calc Af Amer: 94 mL/min/{1.73_m2} (ref >59)
GFR calc non Af Amer: 81 mL/min/{1.73_m2} (ref >59)
Glucose: 103 mg/dL — ABNORMAL HIGH (ref 65–99)
Potassium: 4.1 mmol/L (ref 3.5–5.2)
Sodium: 143 mmol/L (ref 134–144)

## 2020-05-20 ENCOUNTER — Ambulatory Visit (INDEPENDENT_AMBULATORY_CARE_PROVIDER_SITE_OTHER): Payer: Self-pay | Admitting: Family Medicine

## 2020-05-20 ENCOUNTER — Other Ambulatory Visit: Payer: Self-pay

## 2020-05-20 ENCOUNTER — Encounter: Payer: Self-pay | Admitting: Family Medicine

## 2020-05-20 VITALS — BP 174/108 | Wt 162.0 lb

## 2020-05-20 DIAGNOSIS — K219 Gastro-esophageal reflux disease without esophagitis: Secondary | ICD-10-CM

## 2020-05-20 DIAGNOSIS — G43009 Migraine without aura, not intractable, without status migrainosus: Secondary | ICD-10-CM

## 2020-05-20 DIAGNOSIS — R12 Heartburn: Secondary | ICD-10-CM

## 2020-05-20 DIAGNOSIS — I10 Essential (primary) hypertension: Secondary | ICD-10-CM

## 2020-05-20 DIAGNOSIS — L732 Hidradenitis suppurativa: Secondary | ICD-10-CM

## 2020-05-20 DIAGNOSIS — E782 Mixed hyperlipidemia: Secondary | ICD-10-CM

## 2020-05-20 DIAGNOSIS — G43109 Migraine with aura, not intractable, without status migrainosus: Secondary | ICD-10-CM

## 2020-05-20 MED ORDER — SUMATRIPTAN SUCCINATE 100 MG PO TABS: 100.0000 mg | ORAL_TABLET | ORAL | 1 refills | Status: DC | PRN

## 2020-05-20 MED ORDER — SULFAMETHOXAZOLE-TRIMETHOPRIM 800-160 MG PO TABS: 1.0000 | ORAL_TABLET | Freq: Two times a day (BID) | ORAL | 1 refills | Status: DC

## 2020-05-20 MED ORDER — SUMATRIPTAN SUCCINATE 50 MG PO TABS: 50.0000 mg | ORAL_TABLET | ORAL | 1 refills | Status: DC | PRN

## 2020-05-20 MED ORDER — ATORVASTATIN CALCIUM 20 MG PO TABS: 20.0000 mg | ORAL_TABLET | Freq: Every day | ORAL | 3 refills | Status: DC

## 2020-05-20 MED ORDER — OMEPRAZOLE 20 MG PO CPDR: 20.0000 mg | DELAYED_RELEASE_CAPSULE | Freq: Every day | ORAL | 3 refills | Status: DC

## 2020-05-20 NOTE — Patient Instructions (Addendum)
It was a pleasure to see you today!  1. We will check your liver function today as you are on a statin and will increase that to moderate level (lipitor 20 mg) due to ASCVD risk.  2. Please take your medication daily as prescribed to control blood pressure. Also see below for the DASH diet plan to help lower blood pressure.  3. Quitting smoking is the single best thing you can do for your health. When you are ready to quit, please let me know and I am happy to help.  4. If hidradenitis (boils) get worse, I recommend contacting me and I can get you refills of bactrim or other treatment  5. The orange card is one way you can likely get coverage and some help with medications and procedures.   DASH Eating Plan DASH stands for "Dietary Approaches to Stop Hypertension." The DASH eating plan is a healthy eating plan that has been shown to reduce high blood pressure (hypertension). It may also reduce your risk for type 2 diabetes, heart disease, and stroke. The DASH eating plan may also help with weight loss. What are tips for following this plan?  General guidelines  Avoid eating more than 2,300 mg (milligrams) of salt (sodium) a day. If you have hypertension, you may need to reduce your sodium intake to 1,500 mg a day.  Limit alcohol intake to no more than 1 drink a day for nonpregnant women and 2 drinks a day for men. One drink equals 12 oz of beer, 5 oz of wine, or 1 oz of hard liquor.  Work with your health care provider to maintain a healthy body weight or to lose weight. Ask what an ideal weight is for you.  Get at least 30 minutes of exercise that causes your heart to beat faster (aerobic exercise) most days of the week. Activities may include walking, swimming, or biking.  Work with your health care provider or diet and nutrition specialist (dietitian) to adjust your eating plan to your individual calorie needs. Reading food labels   Check food labels for the amount of sodium per  serving. Choose foods with less than 5 percent of the Daily Value of sodium. Generally, foods with less than 300 mg of sodium per serving fit into this eating plan.  To find whole grains, look for the word "whole" as the first word in the ingredient list. Shopping  Buy products labeled as "low-sodium" or "no salt added."  Buy fresh foods. Avoid canned foods and premade or frozen meals. Cooking  Avoid adding salt when cooking. Use salt-free seasonings or herbs instead of table salt or sea salt. Check with your health care provider or pharmacist before using salt substitutes.  Do not fry foods. Cook foods using healthy methods such as baking, boiling, grilling, and broiling instead.  Cook with heart-healthy oils, such as olive, canola, soybean, or sunflower oil. Meal planning  Eat a balanced diet that includes: ? 5 or more servings of fruits and vegetables each day. At each meal, try to fill half of your plate with fruits and vegetables. ? Up to 6-8 servings of whole grains each day. ? Less than 6 oz of lean meat, poultry, or fish each day. A 3-oz serving of meat is about the same size as a deck of cards. One egg equals 1 oz. ? 2 servings of low-fat dairy each day. ? A serving of nuts, seeds, or beans 5 times each week. ? Heart-healthy fats. Healthy fats called Omega-3  fatty acids are found in foods such as flaxseeds and coldwater fish, like sardines, salmon, and mackerel.  Limit how much you eat of the following: ? Canned or prepackaged foods. ? Food that is high in trans fat, such as fried foods. ? Food that is high in saturated fat, such as fatty meat. ? Sweets, desserts, sugary drinks, and other foods with added sugar. ? Full-fat dairy products.  Do not salt foods before eating.  Try to eat at least 2 vegetarian meals each week.  Eat more home-cooked food and less restaurant, buffet, and fast food.  When eating at a restaurant, ask that your food be prepared with less salt or  no salt, if possible. What foods are recommended? The items listed may not be a complete list. Talk with your dietitian about what dietary choices are best for you. Grains Whole-grain or whole-wheat bread. Whole-grain or whole-wheat pasta. Brown rice. Orpah Cobb. Bulgur. Whole-grain and low-sodium cereals. Pita bread. Low-fat, low-sodium crackers. Whole-wheat flour tortillas. Vegetables Fresh or frozen vegetables (raw, steamed, roasted, or grilled). Low-sodium or reduced-sodium tomato and vegetable juice. Low-sodium or reduced-sodium tomato sauce and tomato paste. Low-sodium or reduced-sodium canned vegetables. Fruits All fresh, dried, or frozen fruit. Canned fruit in natural juice (without added sugar). Meat and other protein foods Skinless chicken or [place]. Ground chicken or [place]. Pork with fat trimmed off. Fish and seafood. Egg whites. Dried beans, peas, or lentils. Unsalted nuts, nut butters, and seeds. Unsalted canned beans. Lean cuts of beef with fat trimmed off. Low-sodium, lean deli meat. Dairy Low-fat (1%) or fat-free (skim) milk. Fat-free, low-fat, or reduced-fat cheeses. Nonfat, low-sodium ricotta or cottage cheese. Low-fat or nonfat yogurt. Low-fat, low-sodium cheese. Fats and oils Soft margarine without trans fats. Vegetable oil. Low-fat, reduced-fat, or light mayonnaise and salad dressings (reduced-sodium). Canola, safflower, olive, soybean, and sunflower oils. Avocado. Seasoning and other foods Herbs. Spices. Seasoning mixes without salt. Unsalted popcorn and pretzels. Fat-free sweets. What foods are not recommended? The items listed may not be a complete list. Talk with your dietitian about what dietary choices are best for you. Grains Baked goods made with fat, such as croissants, muffins, or some breads. Dry pasta or rice meal packs. Vegetables Creamed or fried vegetables. Vegetables in a cheese sauce. Regular canned vegetables (not low-sodium or reduced-sodium).  Regular canned tomato sauce and paste (not low-sodium or reduced-sodium). Regular tomato and vegetable juice (not low-sodium or reduced-sodium). Rosita Fire. Olives. Fruits Canned fruit in a light or heavy syrup. Fried fruit. Fruit in cream or butter sauce. Meat and other protein foods Fatty cuts of meat. Ribs. Fried meat. Tomasa Blase. Sausage. Bologna and other processed lunch meats. Salami. Fatback. Hotdogs. Bratwurst. Salted nuts and seeds. Canned beans with added salt. Canned or smoked fish. Whole eggs or egg yolks. Chicken or [place] with skin. Dairy Whole or 2% milk, cream, and half-and-half. Whole or full-fat cream cheese. Whole-fat or sweetened yogurt. Full-fat cheese. Nondairy creamers. Whipped toppings. Processed cheese and cheese spreads. Fats and oils Butter. Stick margarine. Lard. Shortening. Ghee. Bacon fat. Tropical oils, such as coconut, palm kernel, or palm oil. Seasoning and other foods Salted popcorn and pretzels. Onion salt, garlic salt, seasoned salt, table salt, and sea salt. Worcestershire sauce. Tartar sauce. Barbecue sauce. Teriyaki sauce. Soy sauce, including reduced-sodium. Steak sauce. Canned and packaged gravies. Fish sauce. Oyster sauce. Cocktail sauce. Horseradish that you find on the shelf. Ketchup. Mustard. Meat flavorings and tenderizers. Bouillon cubes. Hot sauce and Tabasco sauce. Premade or packaged marinades. Premade or  packaged taco seasonings. Relishes. Regular salad dressings. Where to find more information:  National Heart, Lung, and Blood Institute: PopSteam.is  American Heart Association: www.heart.org Summary  The DASH eating plan is a healthy eating plan that has been shown to reduce high blood pressure (hypertension). It may also reduce your risk for type 2 diabetes, heart disease, and stroke.  With the DASH eating plan, you should limit salt (sodium) intake to 2,300 mg a day. If you have hypertension, you may need to reduce your sodium intake to 1,500 mg  a day.  When on the DASH eating plan, aim to eat more fresh fruits and vegetables, whole grains, lean proteins, low-fat dairy, and heart-healthy fats.  Work with your health care provider or diet and nutrition specialist (dietitian) to adjust your eating plan to your individual calorie needs. This information is not intended to replace advice given to you by your health care provider. Make sure you discuss any questions you have with your health care provider. Document Revised: 08/23/2017 Document Reviewed: 09/03/2016 Elsevier Patient Education  2020 [employer].

## 2020-05-20 NOTE — Progress Notes (Signed)
SUBJECTIVE:   CHIEF COMPLAINT / HPI: f/u htn Patient here for follow-up of elevated blood pressure.  She reports that she has BP 130-140 when taking verapamil twice a day, and 110-120 when taking verapamil TID. Today she has not taken verapamil at all and BP is elevated. Cardiac symptoms: none. Patient denies: chest pain, dyspnea, fatigue, lower extremity edema, near-syncope, orthopnea, palpitations, syncope and tachypnea. Cardiovascular risk factors: dyslipidemia, hypertension and smoking/ tobacco exposure. Use of agents associated with hypertension: none. History of target organ damage: none.  We discussed patient's smoking history and she reports that she knows she should stop, but is not ready to do so yet.  Migraines: patient has long h/o migraine headaches that come on intermittently and are moderate intensity. Pain is throbbing in nature, starts gradually, back to front, most pain at peak is in forehead or behind eyes, bilaterally. She denies changes to speech, motion, sensation, no thunderclap headache, etc. She has had good response to imitrex 100mg  for abortive tx and would like this again.  HLD: no problem using statin, LFTs never checked. Will obtain them today.  Review of Systems Pertinent items noted in HPI and remainder of comprehensive ROS otherwise negative.   PERTINENT  PMH / PSH: HTN, smoking OBJECTIVE:   BP (!) 174/108   Wt 162 lb (73.5 kg)   LMP 05/13/2020 (Approximate)   BMI 26.96 kg/m   Physical Exam Vitals and nursing note reviewed.  Constitutional:      General: She is not in acute distress.    Appearance: Normal appearance. She is normal weight. She is not ill-appearing, toxic-appearing or diaphoretic.  HENT:     Head: Normocephalic and atraumatic.  Eyes:     Conjunctiva/sclera: Conjunctivae normal.     Pupils: Pupils are equal, round, and reactive to light.  Cardiovascular:     Rate and Rhythm: Normal rate and regular rhythm.     Pulses: Normal  pulses.     Heart sounds: Normal heart sounds. No murmur heard.  No friction rub. No gallop.   Pulmonary:     Effort: Pulmonary effort is normal.     Breath sounds: Normal breath sounds.  Skin:    General: Skin is warm and dry.  Neurological:     General: No focal deficit present.     Mental Status: She is alert. Mental status is at baseline.     Comments: Cranial Nerves: 05/15/2020. III,IV, VI: EOMI without ptosis or diplopia.  V: Facial sensation is symmetric to touch VII: Facial movement is symmetric.  VIII: Hearing is intact to voice X: Palate elevates symmetrically XI: Shoulder shrug is symmetric. XII: tongue is midline without atrophy or fasciculations.  Motor: Tone is normal. Bulk is normal. 5/5 strength was present in all four extremities.  Sensory: Sensation is symmetric to light touch in the arms and legs. Deep Tendon Reflexes: 2+ and symmetric in the biceps and patellae.    Psychiatric:        Mood and Affect: Mood normal.        Behavior: Behavior normal.    ASSESSMENT/PLAN:   Essential hypertension Under control when she takes her medications. Recommend continued compliance. Counseled on DASH diet. Counseled on smoking cessation, patient is in precontemplative stage. - Medications: verapamil, losartan - Compliance: moderate - Checking BP at home: yes - Denies any SOB, CP, vision changes, LE edema, medication SEs, or symptoms of hypotension - Diet: SAD - Exercise: minimal   Hidradenitis Patient has had success with bactrim  in the past. 2 resolving boils under breasts, one coming up on posterior. Patient has silvadene at home.  - bactrim DS x 5d   Heartburn Patient with h/o GERD, would like refill of omeprazole, will reassess at next visit. Smoking likely not helping.  Migraine Patient has long h/o migraine headaches without aura. No red flag symptoms. She responds well to imitrex and would like to have this on hand for abortive tx. Headaches are  intermittent, moderate intensity. Patient not interested in other ppx. - imitrex 100mg       , MD [facility]

## 2020-05-21 LAB — COMPREHENSIVE METABOLIC PANEL
ALT: 13 IU/L (ref 0–32)
AST: 13 IU/L (ref 0–40)
Albumin/Globulin Ratio: 1.4 (ref 1.2–2.2)
Albumin: 4.2 g/dL (ref 3.8–4.8)
Alkaline Phosphatase: 63 IU/L (ref 48–121)
BUN/Creatinine Ratio: 9 (ref 9–23)
BUN: 8 mg/dL (ref 6–24)
Bilirubin Total: 0.6 mg/dL (ref 0.0–1.2)
CO2: 25 mmol/L (ref 20–29)
Calcium: 9.4 mg/dL (ref 8.7–10.2)
Chloride: 105 mmol/L (ref 96–106)
Creatinine, Ser: 0.9 mg/dL (ref 0.57–1.00)
GFR calc Af Amer: 89 mL/min/{1.73_m2} (ref >59)
GFR calc non Af Amer: 77 mL/min/{1.73_m2} (ref >59)
Globulin, Total: 3 g/dL (ref 1.5–4.5)
Glucose: 85 mg/dL (ref 65–99)
Potassium: 4.1 mmol/L (ref 3.5–5.2)
Sodium: 142 mmol/L (ref 134–144)
Total Protein: 7.2 g/dL (ref 6.0–8.5)

## 2020-05-22 ENCOUNTER — Encounter: Payer: Self-pay | Admitting: Family Medicine

## 2020-05-22 DIAGNOSIS — E785 Hyperlipidemia, unspecified: Secondary | ICD-10-CM | POA: Insufficient documentation

## 2020-05-22 DIAGNOSIS — G43909 Migraine, unspecified, not intractable, without status migrainosus: Secondary | ICD-10-CM | POA: Insufficient documentation

## 2020-05-22 NOTE — Assessment & Plan Note (Signed)
Patient with h/o GERD, would like refill of omeprazole, will reassess at next visit. Smoking likely not helping.

## 2020-05-22 NOTE — Assessment & Plan Note (Signed)
Patient has long h/o migraine headaches without aura. No red flag symptoms. She responds well to imitrex and would like to have this on hand for abortive tx. Headaches are intermittent, moderate intensity. Patient not interested in other ppx. - imitrex 100mg 

## 2020-05-22 NOTE — Assessment & Plan Note (Addendum)
Obtain CMP to assess LFTs on statin. Due to ASCVD risk of 20%, will increase to lipitor 20mg  daily. Patient denies problems with statin.

## 2020-05-22 NOTE — Assessment & Plan Note (Signed)
Patient has had success with bactrim in the past. 2 resolving boils under breasts, one coming up on posterior. Patient has silvadene at home.  - bactrim DS x 5d

## 2020-05-22 NOTE — Assessment & Plan Note (Signed)
Under control when she takes her medications. Recommend continued compliance. Counseled on DASH diet. Counseled on smoking cessation, patient is in precontemplative stage. - Medications: verapamil, losartan - Compliance: moderate - Checking BP at home: yes - Denies any SOB, CP, vision changes, LE edema, medication SEs, or symptoms of hypotension - Diet: SAD - Exercise: minimal

## 2020-06-21 ENCOUNTER — Other Ambulatory Visit: Payer: Self-pay | Admitting: Family Medicine

## 2020-06-21 ENCOUNTER — Encounter: Payer: Self-pay | Admitting: Family Medicine

## 2020-06-21 DIAGNOSIS — L732 Hidradenitis suppurativa: Secondary | ICD-10-CM

## 2020-06-21 MED ORDER — SULFAMETHOXAZOLE-TRIMETHOPRIM 800-160 MG PO TABS: 1.0000 | ORAL_TABLET | Freq: Two times a day (BID) | ORAL | 0 refills | Status: AC

## 2020-06-25 ENCOUNTER — Other Ambulatory Visit: Payer: Self-pay | Admitting: Family Medicine

## 2020-06-25 DIAGNOSIS — I10 Essential (primary) hypertension: Secondary | ICD-10-CM

## 2020-07-26 ENCOUNTER — Encounter: Payer: Self-pay | Admitting: Family Medicine

## 2020-07-27 ENCOUNTER — Other Ambulatory Visit: Payer: Self-pay | Admitting: Family Medicine

## 2020-07-27 DIAGNOSIS — L732 Hidradenitis suppurativa: Secondary | ICD-10-CM

## 2020-07-27 MED ORDER — SULFAMETHOXAZOLE-TRIMETHOPRIM 800-160 MG PO TABS: 1.0000 | ORAL_TABLET | Freq: Two times a day (BID) | ORAL | 1 refills | Status: DC

## 2020-07-29 ENCOUNTER — Encounter: Payer: Self-pay | Admitting: Family Medicine

## 2020-08-03 ENCOUNTER — Ambulatory Visit (INDEPENDENT_AMBULATORY_CARE_PROVIDER_SITE_OTHER): Payer: Self-pay | Admitting: Family Medicine

## 2020-08-03 ENCOUNTER — Other Ambulatory Visit (HOSPITAL_COMMUNITY)
Admission: RE | Admit: 2020-08-03 | Discharge: 2020-08-03 | Disposition: A | Discharge status: Home / Self Care | Payer: Self-pay | Source: Ambulatory Visit | Attending: Family Medicine | Admitting: Family Medicine

## 2020-08-03 ENCOUNTER — Other Ambulatory Visit: Payer: Self-pay

## 2020-08-03 VITALS — BP 170/98 | HR 91 | Wt 166.8 lb

## 2020-08-03 DIAGNOSIS — I10 Essential (primary) hypertension: Secondary | ICD-10-CM

## 2020-08-03 DIAGNOSIS — N926 Irregular menstruation, unspecified: Secondary | ICD-10-CM

## 2020-08-03 DIAGNOSIS — N760 Acute vaginitis: Secondary | ICD-10-CM

## 2020-08-03 DIAGNOSIS — B9689 Other specified bacterial agents as the cause of diseases classified elsewhere: Secondary | ICD-10-CM

## 2020-08-03 LAB — POCT WET PREP (WET MOUNT)
Clue Cells Wet Prep Whiff POC: POSITIVE
Trichomonas Wet Prep HPF POC: ABSENT

## 2020-08-03 LAB — POCT URINE PREGNANCY: Preg Test, Ur: NEGATIVE

## 2020-08-03 NOTE — Patient Instructions (Signed)
It was a pleasure to see you today!  Thank you for choosing [facility] for your primary care.  Anne Gonzalez was seen for prolonged menstrual bleeding   Our plans for today were:  I am obtaining some blood work. I will call you with the results and next steps  Follow up with PCP for further evaluation if the recurs  I am scheduling you for a pelvic ultrasound. We will notify you when this Is scheduled.    Best Wishes,   Orpah Cobb, DO

## 2020-08-03 NOTE — Progress Notes (Signed)
Subjective:   Patient ID: Anne Gonzalez    DOB: 03-14-1976, 44 y.o. female   MRN: [id number]  Anne Gonzalez is a 44 y.o. female with a history of essential hypertension, migraines, hidradenitis, fatigue, heartburn, hyperlipidemia, tobacco use disorder here for prolonged period.  Abnormal Uterine Bleeding/Prolonged Menstrual Bleeding:  Patient presents with prolonged menstrual bleeding. She notes her last menstrual period started on 10/23 and just ended on 11/9. She notes is the first time this is ever happened. She normally has regular monthly periods. She did note that this cycle was approximately 2 weeks late. She notes that this period was of moderate flow and occasional clots. She is about 3 pads/tampons per day which is normal for her. She notes her normal menstrual cycle last about 4 days, has lots of clots since her tubal ligation. She is sexually active with one female partner but she is not concerned about STDs. Denies any fevers, vaginal discharge, dyspareunia, galactorrhea, prior uterine procedures other than her tubal ligation in 2009. Occasional nocturnal hot flashes. She is a G4, P4. She is not on any anticoagulation and denies any personal or family history of bleeding disorders.   Last pap in 10/16/2018: negative  Trauma (Acute or Chronic): none Relevant PMH/PSH:  Prior Pelvic Surgery: tubal ligation  Prior Pelvic Irradiation: no    Thyroid Disease: Right thyroid removed for enlargement,   Coagulopathy: none         Family History coagulopathy: sickle cell disease in the family,  Medications:  Anticoagulants/Antiplatelets: none  Hormone/OCP:  no       Tamoxifen: no  Intrauterine devices (dates): no  Antidepressants: no   Antipsychotics: no Corticosteroids: no   Chemotherapy: no Endometrial Cancer Risk Factors:   PCOS diagnosis: no Menarche age: 84   FH Late Menopause age: no, her family entered in early 35's       DIABETES: no       Unopposed Estrogen  Therapy: no   Family History Colon, Breast, ovarian, uterine Cancers: uterine cancer on maternal and paternal side, No BC or ovarian cancer or colon cancer. Sexual history -  1 female partner  History of sexually transmitted infections: no Contraceptive history:     Current: tubal ligation in 2009     Past: Nuvaring, Depo, OCPs  ROS: Lactation: no  Stress: yes  Eating D/O: none  Intense Exercise: none Remaining ROS: per HPI  Objective:   BP (!) 170/98   Pulse 91   Wt 166 lb 12.8 oz (75.7 kg)   SpO2 98%   BMI 27.76 kg/m  Vitals and nursing note reviewed.  General: pleasant middle aged woamn, sitting comfortably in exam chair, well nourished, well developed, in no acute distress with non-toxic appearance CV: regular rate and rhythm without murmurs, rubs, or gallops, 2+ radial and pulses bilaterally Lungs: clear to auscultation bilaterally with normal work of breathing on room air, speaking in full sentences Abdomen: soft, non-tender, non-distended, no pelvic pain Skin: warm, dry Extremities: warm and well perfused, normal tone MSK: gait normal Neuro: Alert and oriented, speech normal Pelvic exam: VULVA: normal appearing vulva with no masses, tenderness or lesions, VAGINA: normal appearing vagina with normal color and discharge, no lesions, CERVIX: normal appearing cervix without discharge or lesions, cervical discharge present - mucous/brown tinged, UTERUS: uterus is normal size, nontender, few possible irregularies appreciated on palpation , ADNEXA: normal adnexa in size, nontender and no masses Exam chaperoned by Maree Erie   Assessment & Plan:   Essential  hypertension Chronic, uncontrolled. Elevated during visit. Patient had not taken BP medications. Recommended she take BP meds and recheck BP when at home to ensure improvement. If remains elevated, recommend follow up with PCP for further evaluation.   Abnormal menstruation Acute. Prolonged menstrual cycle x 1 month that  has self resolved. Pelvic exam notable for possible uterine irregularities concerning for fibroids. Significant family history of uterine cancer.  Differenital: uterine fibroid, adenomyosis, endometrial polyps, Uterine arteriovenous malformation, malignancy, premenopausal changes. CBC with normal Hgb of 11.5 (slighly lower than baseline of 13) and platelets. CMP with normal electrolytes, kidney function, and liver function. TSH normal. Upreg neg. Wet prep notable for bacterial vaginosis. Negative GC/Cl/Trich - follow up pelvic ultrasound with transvaginal  - continue to monitor for recurrence of abnormal menstrual bleeding - consider endometrial biopsy pending work up given age >37 and family history of uterine cancer - Metronidazole x 7 days for BV  Bacterial vaginosis Wet prep notable for BV Metronidazole 500mg  BID x 7 days   Orders Placed This Encounter  Procedures  . Pelvic Complete With Transvaginal    Standing Status:   Future    Standing Expiration Date:   08/03/2021    Order Specific Question:   Reason for Exam (SYMPTOM  OR DIAGNOSIS REQUIRED)    Answer:   prolonged menstrual bleeding    Order Specific Question:   Preferred imaging location?    Answer:   [facility]  . CBC  . TSH  . Comprehensive metabolic panel  . POCT urine pregnancy  . POCT Wet Prep [facility])   Meds ordered this encounter  Medications  . metroNIDAZOLE (FLAGYL) 500 MG tablet    Sig: Take 1 tablet (500 mg total) by mouth 2 (two) times daily.    Dispense:  21 tablet    Refill:  0    [facility], DO PGY-3, [facility] 08/04/2020 4:31 PM

## 2020-08-04 DIAGNOSIS — B9689 Other specified bacterial agents as the cause of diseases classified elsewhere: Secondary | ICD-10-CM | POA: Insufficient documentation

## 2020-08-04 DIAGNOSIS — N926 Irregular menstruation, unspecified: Secondary | ICD-10-CM

## 2020-08-04 DIAGNOSIS — N760 Acute vaginitis: Secondary | ICD-10-CM | POA: Insufficient documentation

## 2020-08-04 HISTORY — DX: Irregular menstruation, unspecified: N92.6

## 2020-08-04 LAB — COMPREHENSIVE METABOLIC PANEL
ALT: 21 IU/L (ref 0–32)
AST: 16 IU/L (ref 0–40)
Albumin/Globulin Ratio: 1.6 (ref 1.2–2.2)
Albumin: 4.2 g/dL (ref 3.8–4.8)
Alkaline Phosphatase: 69 IU/L (ref 44–121)
BUN/Creatinine Ratio: 12 (ref 9–23)
BUN: 11 mg/dL (ref 6–24)
Bilirubin Total: 0.3 mg/dL (ref 0.0–1.2)
CO2: 22 mmol/L (ref 20–29)
Calcium: 9.6 mg/dL (ref 8.7–10.2)
Chloride: 105 mmol/L (ref 96–106)
Creatinine, Ser: 0.9 mg/dL (ref 0.57–1.00)
GFR calc Af Amer: 89 mL/min/{1.73_m2} (ref >59)
GFR calc non Af Amer: 77 mL/min/{1.73_m2} (ref >59)
Globulin, Total: 2.6 g/dL (ref 1.5–4.5)
Glucose: 99 mg/dL (ref 65–99)
Potassium: 4.3 mmol/L (ref 3.5–5.2)
Sodium: 142 mmol/L (ref 134–144)
Total Protein: 6.8 g/dL (ref 6.0–8.5)

## 2020-08-04 LAB — CBC
Hematocrit: 35.3 % (ref 34.0–46.6)
Hemoglobin: 11.5 g/dL (ref 11.1–15.9)
MCH: 30.9 pg (ref 26.6–33.0)
MCHC: 32.6 g/dL (ref 31.5–35.7)
MCV: 95 fL (ref 79–97)
Platelets: 382 10*3/uL (ref 150–450)
RBC: 3.72 x10E6/uL — ABNORMAL LOW (ref 3.77–5.28)
RDW: 11.5 % — ABNORMAL LOW (ref 11.7–15.4)
WBC: 9.3 10*3/uL (ref 3.4–10.8)

## 2020-08-04 LAB — CERVICOVAGINAL ANCILLARY ONLY
Chlamydia: NEGATIVE
Comment: NEGATIVE
Comment: NEGATIVE
Comment: NORMAL
Neisseria Gonorrhea: NEGATIVE
Trichomonas: NEGATIVE

## 2020-08-04 LAB — TSH: TSH: 1.43 u[IU]/mL (ref 0.450–4.500)

## 2020-08-04 MED ORDER — METRONIDAZOLE 500 MG PO TABS: 500.0000 mg | ORAL_TABLET | Freq: Two times a day (BID) | ORAL | 0 refills | Status: DC

## 2020-08-04 NOTE — Assessment & Plan Note (Signed)
Wet prep notable for BV Metronidazole 500mg  BID x 7 days

## 2020-08-04 NOTE — Assessment & Plan Note (Signed)
Chronic, uncontrolled. Elevated during visit. Patient had not taken BP medications. Recommended she take BP meds and recheck BP when at home to ensure improvement. If remains elevated, recommend follow up with PCP for further evaluation.

## 2020-08-04 NOTE — Assessment & Plan Note (Addendum)
Acute. Prolonged menstrual cycle x 1 month that has self resolved. Pelvic exam notable for possible uterine irregularities concerning for fibroids. Significant family history of uterine cancer.  Differenital: uterine fibroid, adenomyosis, endometrial polyps, Uterine arteriovenous malformation, malignancy, premenopausal changes. CBC with normal Hgb of 11.5 (slighly lower than baseline of 13) and platelets. CMP with normal electrolytes, kidney function, and liver function. TSH normal. Upreg neg. Wet prep notable for bacterial vaginosis. Negative GC/Cl/Trich - follow up pelvic ultrasound with transvaginal  - continue to monitor for recurrence of abnormal menstrual bleeding - consider endometrial biopsy pending work up given age >69 and family history of uterine cancer - Metronidazole x 7 days for BV

## 2020-08-10 ENCOUNTER — Ambulatory Visit (HOSPITAL_COMMUNITY): Payer: Self-pay

## 2020-08-12 ENCOUNTER — Encounter: Payer: Self-pay | Admitting: Family Medicine

## 2020-08-12 ENCOUNTER — Ambulatory Visit (HOSPITAL_COMMUNITY)
Admission: RE | Admit: 2020-08-12 | Discharge: 2020-08-12 | Disposition: A | Discharge status: Home / Self Care | Payer: Self-pay | Source: Ambulatory Visit | Attending: Family Medicine | Admitting: Family Medicine

## 2020-08-12 ENCOUNTER — Other Ambulatory Visit: Payer: Self-pay

## 2020-08-12 DIAGNOSIS — L732 Hidradenitis suppurativa: Secondary | ICD-10-CM

## 2020-08-12 DIAGNOSIS — N926 Irregular menstruation, unspecified: Secondary | ICD-10-CM | POA: Insufficient documentation

## 2020-08-12 MED ORDER — SULFAMETHOXAZOLE-TRIMETHOPRIM 800-160 MG PO TABS: 1.0000 | ORAL_TABLET | Freq: Two times a day (BID) | ORAL | 1 refills | Status: AC

## 2020-08-15 ENCOUNTER — Telehealth: Payer: Self-pay | Admitting: Family Medicine

## 2020-08-15 NOTE — Telephone Encounter (Signed)
Patient calls nurse line returning Meccariello call. I advised patient of result and need for endometrial biopsy. Patient has been scheduled for 12/9 in [facility]. Patient would like for Meccariello to mychart message her the findings of ultrasound. Patient is at work and can not take any phone calls.

## 2020-08-15 NOTE — Telephone Encounter (Signed)
Mychart message sent.

## 2020-08-15 NOTE — Telephone Encounter (Signed)
Attempted to call patient to discuss ultrasound results.  No answer, left voicemail to call back.  If she calls back, she will need to make an appointment to schedule with our gynecology clinic to have a sampling done of the inside of her uterus, called an endometrial biopsy.

## 2020-09-01 ENCOUNTER — Other Ambulatory Visit: Payer: Self-pay

## 2020-09-01 ENCOUNTER — Ambulatory Visit (INDEPENDENT_AMBULATORY_CARE_PROVIDER_SITE_OTHER): Payer: Self-pay | Admitting: Family Medicine

## 2020-09-01 VITALS — BP 152/96 | HR 77 | Wt 166.0 lb

## 2020-09-01 DIAGNOSIS — N926 Irregular menstruation, unspecified: Secondary | ICD-10-CM

## 2020-09-01 DIAGNOSIS — N951 Menopausal and female climacteric states: Secondary | ICD-10-CM | POA: Insufficient documentation

## 2020-09-01 NOTE — Progress Notes (Signed)
CHIEF COMPLAINT / HPI: Patient here for further evaluation and management of abnormal menstrual cycle and follow-up results of pelvic ultrasound.  She has a lot of questions.  She is not really sure what they found that was "abnormal".  She is very nervous.   PERTINENT  PMH / PSH: I have reviewed the patient's medications, allergies, past medical and surgical history, smoking status and updated in the EMR as appropriate. She reports several members of her family having some type of female cancer.  Not sure if it is endometrial or uterine cervical etc.  Both paternal and maternal grandmother and an aunt.  OBJECTIVE:  BP (!) 152/96   Pulse 77   Wt 166 lb (75.3 kg)   LMP 08/26/2020   SpO2 98%   BMI 27.62 kg/m  GENERAL: Well-developed female no acute distress  Laboratory: I reviewed her pelvic ultrasound results with her in detail and gave her a copy of the report.FINDINGS: Uterus  Measurements: 8.1 x 4.6 x 5.1 cm = volume: 9.8 mL. Uterus is anteverted. Heterogeneous echotexture of the uterine myometrium without discrete fibroid or other mass.  Endometrium  Thickness: 8.9 mm.  No focal abnormality visualized.  Right ovary  Measurements: 2.2 x 3.4 x 3.0 cm = volume: 11.9 mL. Normal appearance/no adnexal mass.  Left ovary  Measurements: 2.9 x 1.8 x 1.8 cm = volume: 5.1 mL. 1.4 cm dominant follicle noted. Additional note made of a 2 mm echogenic focus within the left ovary, consistent with a small calcification, of doubtful significance. No other adnexal mass.  Other findings  No abnormal free fluid.  IMPRESSION: 1. Endometrial stripe measures 8.9 mm in thickness without focal abnormality. Correlation with menopausal status recommended (not provided in history for this exam). In the setting of premenopausal bleeding: If bleeding remains unresponsive to hormonal or medical therapy, sonohysterogram should be considered for focal lesion work-up. In the setting  of postmenopausal bleeding: Endometrial sampling is indicated to exclude carcinoma. If results are benign, sonohysterogram should be considered for focal lesion work-up. (Ref: Radiological Reasoning: Algorithmic Workup of Abnormal Vaginal Bleeding with Endovaginal Sonography and Sonohysterography. AJR 2008; 970:Y63-78). 2. Otherwise normal sonographic appearance of the uterus. 3. 2 mm echogenic focus within the left ovary, likely a small calcification.  ASSESSMENT / PLAN:   Abnormal menstruation I do think this may be the beginning of the perimenopause and we discussed that at some length.  Given that this is only one menstrual cycle that has been abnormal and that her pelvic ultrasound was normal for a menstruating female including the stripe of 8.9 millimeters, I gave her the option to do nothing but follow this or perform endometrial biopsy.    She decided to follow-up for the next 2 to 3 months.  Part of the decision-making process for her was her lack of insurance and financial responsibility of procedure.  She will probably have insurance within the next few months.    I do think this is a very appropriate path to follow.  I do not think she needs an endometrial biopsy today.  She is only had 1 abnormal menstrual cycle, and the pelvic ultrasound is very reassuring.  She may or may not need an endometrial biopsy we see her back in 3 months.  Certainly if she does not have any abnormal bleeding or any new symptoms, I would recommend that we do not perform endometrial biopsy.   We also discussed the 2 mm calcification seen in her ovary which I think  is a benign incidental finding as well. She was very reassured by her discussion.  She will keep a bleeding diary and will call in the interim with any new or worsening symptoms, otherwise she will make an appointment to see me in women's health clinic in March 2022.   Denny Levy MD

## 2020-09-01 NOTE — Assessment & Plan Note (Signed)
I do think this may be the beginning of the perimenopause and we discussed that at some length.  Given that this is only one menstrual cycle that has been abnormal and that her pelvic ultrasound was normal for a menstruating female including the stripe of 8.9 millimeters, I gave her the option to do nothing but follow this or perform endometrial biopsy.    She decided to follow-up for the next 2 to 3 months.  Part of the decision-making process for her was her lack of insurance and financial responsibility of procedure.  She will probably have insurance within the next few months.    I do think this is a very appropriate path to follow.  I do not think she needs an endometrial biopsy today.  She is only had 1 abnormal menstrual cycle, and the pelvic ultrasound is very reassuring.  She may or may not need an endometrial biopsy we see her back in 3 months.  Certainly if she does not have any abnormal bleeding or any new symptoms, I would recommend that we do not perform endometrial biopsy.   We also discussed the 2 mm calcification seen in her ovary which I think is a benign incidental finding as well. She was very reassured by her discussion.  She will keep a bleeding diary and will call in the interim with any new or worsening symptoms, otherwise she will make an appointment to see me in women's health clinic in March 2022.

## 2020-09-01 NOTE — Patient Instructions (Signed)
It was great to see you! Thank you for allowing me to participate in your care!  Our plans for today:  -Today we discussed the results of your ultrasound as well as discussing next steps that we can consider.  Per our discussion we plan to have you keep a diary of your periods for the next 3 months and then schedule a follow-up with [place] in women's health clinic, with Dr. Jennette Kettle around March for follow-up.  At that appointment we will discuss how your periods have been over the last 3 months and will consider any neck steps that may be indicated at that time. -If you develop any other concerns do not hesitate to reach out to [place] before then  Happy holidays!   Take care and seek immediate care sooner if you develop any concerns.   Women's health clinic [facility]

## 2020-09-03 ENCOUNTER — Other Ambulatory Visit: Payer: Self-pay | Admitting: Family Medicine

## 2020-09-03 NOTE — Telephone Encounter (Signed)
Rerouting to [facility] 

## 2020-09-07 ENCOUNTER — Other Ambulatory Visit: Payer: Self-pay | Admitting: Family Medicine

## 2020-09-07 DIAGNOSIS — I10 Essential (primary) hypertension: Secondary | ICD-10-CM

## 2020-10-05 ENCOUNTER — Encounter: Payer: Self-pay | Admitting: Family Medicine

## 2020-10-05 DIAGNOSIS — L732 Hidradenitis suppurativa: Secondary | ICD-10-CM

## 2020-10-06 MED ORDER — SULFAMETHOXAZOLE-TRIMETHOPRIM 800-160 MG PO TABS: 1.0000 | ORAL_TABLET | Freq: Two times a day (BID) | ORAL | 1 refills | Status: DC

## 2020-11-21 ENCOUNTER — Encounter: Payer: Self-pay | Admitting: Family Medicine

## 2020-12-15 ENCOUNTER — Other Ambulatory Visit: Payer: Self-pay | Admitting: Family Medicine

## 2020-12-15 DIAGNOSIS — I10 Essential (primary) hypertension: Secondary | ICD-10-CM

## 2020-12-20 ENCOUNTER — Encounter (HOSPITAL_COMMUNITY): Payer: Self-pay | Admitting: Emergency Medicine

## 2020-12-20 ENCOUNTER — Ambulatory Visit (HOSPITAL_COMMUNITY)
Admission: EM | Admit: 2020-12-20 | Discharge: 2020-12-20 | Disposition: A | Discharge status: Home / Self Care | Payer: 59 | Attending: Emergency Medicine | Admitting: Emergency Medicine

## 2020-12-20 ENCOUNTER — Other Ambulatory Visit: Payer: Self-pay

## 2020-12-20 DIAGNOSIS — M542 Cervicalgia: Secondary | ICD-10-CM

## 2020-12-20 DIAGNOSIS — S161XXA Strain of muscle, fascia and tendon at neck level, initial encounter: Secondary | ICD-10-CM | POA: Diagnosis not present

## 2020-12-20 DIAGNOSIS — T148XXA Other injury of unspecified body region, initial encounter: Secondary | ICD-10-CM

## 2020-12-20 MED ORDER — MELOXICAM 7.5 MG PO TABS: 7.5000 mg | ORAL_TABLET | Freq: Every day | ORAL | 0 refills | Status: AC

## 2020-12-20 MED ORDER — NAPROXEN 500 MG PO TABS: 500.0000 mg | ORAL_TABLET | Freq: Two times a day (BID) | ORAL | 0 refills | Status: DC

## 2020-12-20 MED ORDER — TIZANIDINE HCL 4 MG PO TABS: 4.0000 mg | ORAL_TABLET | Freq: Four times a day (QID) | ORAL | 0 refills | Status: DC | PRN

## 2020-12-20 NOTE — ED Provider Notes (Signed)
[facility]    CSN: [id number] Arrival date & time: 12/20/20  4098      History   Chief Complaint Chief Complaint  Patient presents with  . Torticollis    HPI Anne Gonzalez is a 45 y.o. female.   Anne Gonzalez is a 45 y.o. here with chief complaint of right sided neck pain for the past month.  Reports taking prednisone 20mg  x5 days, tylenol, and some muscle relaxers.  She reports that the prednisone and muscle relaxers helped a little but still unable to move neck.  Denies any numbness, tingling, or pain in extremities.  Denies decreased ROM in shoulders or back.  Denies any fevers, chest pain, shortness of breath, N/V/D, numbness, tingling, weakness, abdominal pain, or headaches.   ROS: As per HPI, all other pertinent ROS negative    The history is provided by the patient.    Past Medical History:  Diagnosis Date  . Acid reflux 2005  . Environmental allergies 2019  . Hypertension   . Irregular heart beat 2019  . Migraine 2019  . Sleep apnea 2019    Patient Active Problem List   Diagnosis Date Noted  . Perimenopause 09/01/2020  . Abnormal menstruation 08/04/2020  . Bacterial vaginosis 08/04/2020  . Migraine 05/22/2020  . HLD (hyperlipidemia) 05/22/2020  . Heartburn 11/27/2019  . Hidradenitis 10/20/2019  . Tobacco use disorder 10/20/2019  . Essential hypertension 04/14/2018  . Fatigue 04/11/2018    Past Surgical History:  Procedure Laterality Date  . THYROID SURGERY  2012  . TUBAL LIGATION  2009    OB History   No obstetric history on file.      Home Medications    Prior to Admission medications   Medication Sig Start Date End Date Taking? Authorizing Provider  meloxicam (MOBIC) 7.5 MG tablet Take 1 tablet (7.5 mg total) by mouth daily for 14 days. 12/20/20 01/03/21 Yes 03/05/21, NP  naproxen (NAPROSYN) 500 MG tablet Take 1 tablet (500 mg total) by mouth 2 (two) times daily. 12/20/20  Yes 12/22/20, NP   tiZANidine (ZANAFLEX) 4 MG tablet Take 1 tablet (4 mg total) by mouth every 6 (six) hours as needed for muscle spasms. 12/20/20  Yes 12/22/20, NP  atorvastatin (LIPITOR) 20 MG tablet Take 1 tablet (20 mg total) by mouth daily. 05/20/20   05/22/20, MD  losartan (COZAAR) 100 MG tablet Take 1 tablet by mouth once daily 09/03/20   14/11/21, MD  metroNIDAZOLE (FLAGYL) 500 MG tablet Take 1 tablet (500 mg total) by mouth 2 (two) times daily. 08/04/20   Mullis, Kiersten P, DO  omeprazole (PRILOSEC) 20 MG capsule Take 1 capsule (20 mg total) by mouth daily. 05/20/20   05/22/20, MD  sulfamethoxazole-trimethoprim (BACTRIM DS) 800-160 MG tablet Take 1 tablet by mouth 2 (two) times daily. 10/06/20   10/08/20, MD  SUMAtriptan (IMITREX) 100 MG tablet Take 1 tablet (100 mg total) by mouth every 2 (two) hours as needed for migraine. May repeat in 2 hours if headache persists or recurs. 05/20/20   05/22/20, MD  verapamil (CALAN) 40 MG tablet TAKE 1 TABLET BY MOUTH THREE TIMES DAILY 12/16/20   12/18/20, MD    Family History Family History  Problem Relation Age of Onset  . Diabetes Father   . Hypertension Father   . Heart failure Father   . Alcohol abuse Father   . Drug abuse Father   . Depression Father   .  Hyperlipidemia Father   . Sickle cell anemia Sister     Social History Social History   Tobacco Use  . Smoking status: Current Every Day Smoker    Start date: 33  . Smokeless tobacco: Never Used  Substance Use Topics  . Alcohol use: Yes    Comment: occasional wine  . Drug use: No     Allergies   Hctz [hydrochlorothiazide], Lisinopril, and Pineapple   Review of Systems Review of Systems  Musculoskeletal: Positive for neck pain.     Physical Exam Triage Vital Signs ED Triage Vitals  Enc Vitals Group     BP 12/20/20 1011 (!) 176/112     Pulse Rate 12/20/20 1011 73     Resp 12/20/20 1011 16     Temp 12/20/20 1011 98.5 F (36.9 C)      Temp Source 12/20/20 1011 Oral     SpO2 12/20/20 1011 100 %     Weight --      Height --      Head Circumference --      Peak Flow --      Pain Score 12/20/20 1010 6     Pain Loc --      Pain Edu? --      Excl. in GC? --    No data found.  Updated Vital Signs BP (!) 176/112 (BP Location: Left Arm)   Pulse 73   Temp 98.5 F (36.9 C) (Oral)   Resp 16   LMP 12/20/2020   SpO2 100%   Visual Acuity Right Eye Distance:   Left Eye Distance:   Bilateral Distance:    Right Eye Near:   Left Eye Near:    Bilateral Near:     Physical Exam Vitals and nursing note reviewed.  Constitutional:      General: She is not in acute distress.    Appearance: Normal appearance. She is not ill-appearing, toxic-appearing or diaphoretic.  HENT:     Head: Normocephalic and atraumatic.  Eyes:     Conjunctiva/sclera: Conjunctivae normal.  Cardiovascular:     Rate and Rhythm: Normal rate.     Pulses: Normal pulses.  Pulmonary:     Effort: Pulmonary effort is normal.  Abdominal:     General: Abdomen is flat.  Musculoskeletal:     Right shoulder: Normal. Normal range of motion. Normal strength.     Left shoulder: Normal. Normal range of motion. Normal strength.     Cervical back: Spasms present. No swelling, deformity, signs of trauma, torticollis or bony tenderness. Pain with movement present. Decreased range of motion.  Skin:    General: Skin is warm and dry.  Neurological:     General: No focal deficit present.     Mental Status: She is alert and oriented to person, place, and time.  Psychiatric:        Mood and Affect: Mood normal.      UC Treatments / Results  Labs (all labs ordered are listed, but only abnormal results are displayed) Labs Reviewed - No data to display  EKG   Radiology No results found.  Procedures Procedures (including critical care time)  Medications Ordered in UC Medications - No data to display  Initial Impression / Assessment and Plan / UC  Course  I have reviewed the triage vital signs and the nursing notes.  Pertinent labs & imaging results that were available during my care of the patient were reviewed by me and considered in my medical decision  making (see chart for details).     Muscle Strain Neck Pain Assessment negative for red flags or concerns, including meningitis.   Take Mobic or Naproxen for pain relief.  Zanaflex PRN for muscle spasms.  Heat for comfort and gentle stretching.   Follow up with PCP for possible referral to PT.   Final Clinical Impressions(s) / UC Diagnoses   Final diagnoses:  Muscle strain  Neck pain     Discharge Instructions     Take the either the Mobic or Naproxen for pain relief.  You can take Zanaflex as needed for pain and muscle strain.   Use heat for comfort.  Try gentle stretching.    Follow up with your Primary Care Provider.  You may need a referral to physical therapy.       ED Prescriptions    Medication Sig Dispense Auth. Provider   tiZANidine (ZANAFLEX) 4 MG tablet Take 1 tablet (4 mg total) by mouth every 6 (six) hours as needed for muscle spasms. 30 tablet Ivette Loyal, NP   meloxicam (MOBIC) 7.5 MG tablet Take 1 tablet (7.5 mg total) by mouth daily for 14 days. 14 tablet Chales Salmon R, NP   naproxen (NAPROSYN) 500 MG tablet Take 1 tablet (500 mg total) by mouth 2 (two) times daily. 30 tablet Ivette Loyal, NP     PDMP not reviewed this encounter.   Ivette Loyal, NP 12/20/20 1048

## 2020-12-20 NOTE — Discharge Instructions (Signed)
Take the either the Mobic or Naproxen for pain relief.  You can take Zanaflex as needed for pain and muscle strain.   Use heat for comfort.  Try gentle stretching.    Follow up with your Primary Care Provider.  You may need a referral to physical therapy.

## 2020-12-20 NOTE — ED Triage Notes (Signed)
Pt presetns with right side neck pain xs 3-4 weeks. States has taken 20 mg prednisone daily xs 5 days, muscle relaxers, icy hot.

## 2020-12-31 ENCOUNTER — Ambulatory Visit (HOSPITAL_COMMUNITY)
Admission: EM | Admit: 2020-12-31 | Discharge: 2020-12-31 | Disposition: A | Discharge status: Home / Self Care | Payer: 59

## 2020-12-31 ENCOUNTER — Other Ambulatory Visit: Payer: Self-pay

## 2020-12-31 ENCOUNTER — Encounter (HOSPITAL_COMMUNITY): Payer: Self-pay

## 2020-12-31 DIAGNOSIS — I10 Essential (primary) hypertension: Secondary | ICD-10-CM

## 2020-12-31 DIAGNOSIS — L732 Hidradenitis suppurativa: Secondary | ICD-10-CM

## 2020-12-31 DIAGNOSIS — B354 Tinea corporis: Secondary | ICD-10-CM

## 2020-12-31 MED ORDER — KETOCONAZOLE 2 % EX CREA: 1.0000 "application " | TOPICAL_CREAM | Freq: Every day | CUTANEOUS | 0 refills | Status: AC

## 2020-12-31 NOTE — ED Provider Notes (Signed)
[facility]    CSN: [id number] Arrival date & time: 12/31/20  1028      History   Chief Complaint Chief Complaint  Patient presents with  . Rash    HPI Anne Gonzalez is a 45 y.o. female presenting with rash. Medical history hidradenitis suppurativa, environmental allergies, perimenopause, hypertension, GERD.  Notes 7 years of occasional rash.  Describes this as itchy and painful.  There are 2 spots that will appear, 1 on the right inner arm and one on her lower left abdomen.  They eventually crust over and form scabs and eventually heal into a dark patch of skin.  States this is happened multiple times over the last 7 years, typically once every other year.  She has not identified any triggers.  States this is different than her hidradenitis symptoms.  Has not taken blood pressure medications yet today.  Denies chest pain, dizziness, shortness of breath, left arm pain.  HPI  Past Medical History:  Diagnosis Date  . Acid reflux 2005  . Environmental allergies 2019  . Hypertension   . Irregular heart beat 2019  . Migraine 2019  . Sleep apnea 2019    Patient Active Problem List   Diagnosis Date Noted  . Perimenopause 09/01/2020  . Abnormal menstruation 08/04/2020  . Bacterial vaginosis 08/04/2020  . Migraine 05/22/2020  . HLD (hyperlipidemia) 05/22/2020  . Heartburn 11/27/2019  . Hidradenitis 10/20/2019  . Tobacco use disorder 10/20/2019  . Essential hypertension 04/14/2018  . Fatigue 04/11/2018    Past Surgical History:  Procedure Laterality Date  . THYROID SURGERY  2012  . TUBAL LIGATION  2009    OB History   No obstetric history on file.      Home Medications    Prior to Admission medications   Medication Sig Start Date End Date Taking? Authorizing Provider  doxycycline (VIBRA-TABS) 100 MG tablet Take 100 mg by mouth 2 (two) times daily.   Yes [provider]  ketoconazole (NIZORAL) 2 % cream Apply 1 application topically  daily for 14 days. 12/31/20 01/14/21 Yes Rhys Martini, PA-C  atorvastatin (LIPITOR) 20 MG tablet Take 1 tablet (20 mg total) by mouth daily. 05/20/20   Shirlean Mylar, MD  losartan (COZAAR) 100 MG tablet Take 1 tablet by mouth once daily 09/03/20   Shirlean Mylar, MD  meloxicam (MOBIC) 7.5 MG tablet Take 1 tablet (7.5 mg total) by mouth daily for 14 days. 12/20/20 01/03/21  Ivette Loyal, NP  metroNIDAZOLE (FLAGYL) 500 MG tablet Take 1 tablet (500 mg total) by mouth 2 (two) times daily. 08/04/20   Mullis, Kiersten P, DO  naproxen (NAPROSYN) 500 MG tablet Take 1 tablet (500 mg total) by mouth 2 (two) times daily. 12/20/20   Ivette Loyal, NP  omeprazole (PRILOSEC) 20 MG capsule Take 1 capsule (20 mg total) by mouth daily. 05/20/20   Shirlean Mylar, MD  sulfamethoxazole-trimethoprim (BACTRIM DS) 800-160 MG tablet Take 1 tablet by mouth 2 (two) times daily. 10/06/20   Shirlean Mylar, MD  SUMAtriptan (IMITREX) 100 MG tablet Take 1 tablet (100 mg total) by mouth every 2 (two) hours as needed for migraine. May repeat in 2 hours if headache persists or recurs. 05/20/20   Shirlean Mylar, MD  tiZANidine (ZANAFLEX) 4 MG tablet Take 1 tablet (4 mg total) by mouth every 6 (six) hours as needed for muscle spasms. 12/20/20   Ivette Loyal, NP  verapamil (CALAN) 40 MG tablet TAKE 1 TABLET BY MOUTH THREE TIMES  DAILY 12/16/20   Shirlean Mylar, MD    Family History Family History  Problem Relation Age of Onset  . Diabetes Father   . Hypertension Father   . Heart failure Father   . Alcohol abuse Father   . Drug abuse Father   . Depression Father   . Hyperlipidemia Father   . Sickle cell anemia Sister     Social History Social History   Tobacco Use  . Smoking status: Current Every Day Smoker    Start date: 72  . Smokeless tobacco: Never Used  Substance Use Topics  . Alcohol use: Yes    Comment: occasional wine  . Drug use: No     Allergies   Aspirin, Hctz [hydrochlorothiazide],  Lisinopril, and Pineapple   Review of Systems Review of Systems  Skin: Positive for rash.  All other systems reviewed and are negative.    Physical Exam Triage Vital Signs ED Triage Vitals  Enc Vitals Group     BP      Pulse      Resp      Temp      Temp src      SpO2      Weight      Height      Head Circumference      Peak Flow      Pain Score      Pain Loc      Pain Edu?      Excl. in GC?    No data found.  Updated Vital Signs BP (!) 187/103 (BP Location: Left Arm)   Pulse 76   Temp 97.6 F (36.4 C) (Oral)   Resp 16   LMP 12/20/2020   SpO2 96%   Visual Acuity Right Eye Distance:   Left Eye Distance:   Bilateral Distance:    Right Eye Near:   Left Eye Near:    Bilateral Near:     Physical Exam Vitals reviewed.  Constitutional:      Appearance: Normal appearance.  HENT:     Head: Normocephalic and atraumatic.  Cardiovascular:     Rate and Rhythm: Normal rate and regular rhythm.     Heart sounds: Normal heart sounds.  Pulmonary:     Effort: Pulmonary effort is normal.     Breath sounds: Normal breath sounds.  Skin:    Comments: (see image below) R upper inner arm and L lower abd- both with circular hypertrophic raised patches. Some central clearing on R arm patch. No warmth, erythema, swelling, discharge.  Neurological:     General: No focal deficit present.     Mental Status: She is alert and oriented to person, place, and time.  Psychiatric:        Mood and Affect: Mood normal.        Behavior: Behavior normal.        Thought Content: Thought content normal.        Judgment: Judgment normal.          UC Treatments / Results  Labs (all labs ordered are listed, but only abnormal results are displayed) Labs Reviewed - No data to display  EKG   Radiology No results found.  Procedures Procedures (including critical care time)  Medications Ordered in UC Medications - No data to display  Initial Impression / Assessment and  Plan / UC Course  I have reviewed the triage vital signs and the nursing notes.  Pertinent labs & imaging results that were available  during my care of the patient were reviewed by me and considered in my medical decision making (see chart for details).     This patient is a 45 year old female presenting with tinea corporis. Today this pt is afebrile nontachycardic nontachypneic, oxygenating well on room air, no wheezes rhonchi or rales.   Ketoconazole cream sent.  Follow-up with Derm if symptoms persist. For hypertension, continue current regimen. ED return precautions discussed.  Final Clinical Impressions(s) / UC Diagnoses   Final diagnoses:  Tinea corporis  Essential hypertension  Hidradenitis suppurativa     Discharge Instructions     -Try the ketoconazole cream, once daily for 2 weeks. -If your symptoms persist, follow-up with the dermatologist.  They can do biopsies and evaluate the rash under the microscope. -Please check your blood pressure at home or at the pharmacy. If this continues to be >140/90, follow-up with your primary care provider for further blood pressure management/ medication titration. If you develop chest pain, shortness of breath, vision changes, the worst headache of your life- head straight to the ED or call 911.     ED Prescriptions    Medication Sig Dispense Auth. Provider   ketoconazole (NIZORAL) 2 % cream Apply 1 application topically daily for 14 days. 14 g Rhys Martini, PA-C     PDMP not reviewed this encounter.   Rhys Martini, PA-C 12/31/20 1157

## 2020-12-31 NOTE — Discharge Instructions (Addendum)
-  Try the ketoconazole cream, once daily for 2 weeks. -If your symptoms persist, follow-up with the dermatologist.  They can do biopsies and evaluate the rash under the microscope. -Please check your blood pressure at home or at the pharmacy. If this continues to be >140/90, follow-up with your primary care provider for further blood pressure management/ medication titration. If you develop chest pain, shortness of breath, vision changes, the worst headache of your life- head straight to the ED or call 911.

## 2020-12-31 NOTE — ED Triage Notes (Signed)
Pt presents with rash in right arm and stomach x 1 day. Reports the rash is "on and off" x 7 years.   Pt started doxycycline yesterday for STD's; results are pending.

## 2021-01-16 ENCOUNTER — Ambulatory Visit (INDEPENDENT_AMBULATORY_CARE_PROVIDER_SITE_OTHER): Payer: 59 | Admitting: Family Medicine

## 2021-01-16 ENCOUNTER — Encounter: Payer: Self-pay | Admitting: Family Medicine

## 2021-01-16 ENCOUNTER — Other Ambulatory Visit: Payer: Self-pay

## 2021-01-16 VITALS — BP 175/104 | HR 85 | Ht 65.0 in | Wt 173.8 lb

## 2021-01-16 DIAGNOSIS — L989 Disorder of the skin and subcutaneous tissue, unspecified: Secondary | ICD-10-CM | POA: Diagnosis not present

## 2021-01-16 DIAGNOSIS — L732 Hidradenitis suppurativa: Secondary | ICD-10-CM

## 2021-01-16 DIAGNOSIS — I10 Essential (primary) hypertension: Secondary | ICD-10-CM

## 2021-01-16 MED ORDER — TRIAMCINOLONE ACETONIDE 0.5 % EX OINT: 1.0000 "application " | TOPICAL_OINTMENT | Freq: Two times a day (BID) | CUTANEOUS | 0 refills | Status: DC

## 2021-01-16 NOTE — Patient Instructions (Signed)
The lesion that you have is hard to define, but since antifungal medications did not work we will try a steroid cream and I would like to try and get you into our derm clinic so that we can possibly get a sample of the area.

## 2021-01-16 NOTE — Progress Notes (Signed)
    SUBJECTIVE:   CHIEF COMPLAINT / HPI:   Skin lesions Patient reports that over the last 7 to 8 years she has had about 3 episodes of these recurrent skin lesions on her stomach and arm.  She states that the lesion started as a mild ache to get hot and then becomes erythematous and painful and enlarges.  It then goes into a blister phase that will then open and slowly heal over (pruritus with the healing stage). She reports that it is never had drainage or discharge. Most recent rash started on April 8, patient went to urgent care on the ninth and was given ketoconazole for presumed tinea corporis.  Patient took econazole x2 weeks with no improvement in symptoms.  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  She states she is also had the nurse practitioner that she works with look at the lesion and they are unsure of what it is as well.    Patient reports that she has a history of hidradenitis suppurativa, but that these lesions she has been having are different.  Hypertension Patient reports she has a history of hypertension and that she has "been walking around over 200 systolic and felt fine".  She has a aunt that died of a "double aneurysm" at 29 and since she is turned 31 the patient has become much more conscious about her health.  Patient medications currently include verapamil 40 mg, losartan 100 mg.  PERTINENT  PMH / PSH: Hidradenitis suppurativa, hypertension  OBJECTIVE:   BP (!) 175/104   Pulse 85   Ht 5\' 5"  (1.651 m)   Wt 173 lb 12.8 oz (78.8 kg)   LMP 12/20/2020   SpO2 97%   BMI 28.92 kg/m   General: Well-appearing, well-nourished female sitting in clinic Integument: Annular healing skin lesion in left lower abdomen and right upper extremity as seen in pictures below.  No fluid collection, no drainage or open segments of the wound  ASSESSMENT/PLAN:  Skin  lesions  Patient with history of HS presents with annular skin lesion on left lower abdomen and right upper extremity.  Lesions are recurrent (3 times over the last 7 years), present in the same area and leave hyperpigmented areas. Not entirely sure of the diagnosis, but given the patient was treated with antifungals without significant improvement, we will prescribe steroids and plan for the patient to be seen by our dermatology clinic for possible biopsy.  DDx: Nummular eczema (would be atypical presentation as eczema does not typically blister, and is not significantly pruritic), psoriasis (no evidence of a silvery scale), tinea corporis (highly unlikely given lack of response to ketoconazole x2 weeks), granuloma annulare (timeline is quicker than would be expected in border not the characteristic firmness) -Triamcinolone 0.5 ointment twice daily x2 weeks -Schedule appointment with Derm clinic, consider biopsy  HTN Patient BP 175/104, patient asymptomatic.  Patient reports she typically takes her medications at this time.  No neurologic symptoms or findings. -Patient reports she will take blood pressure medications when she gets home -Would recommend stricter control and blood pressure monitoring at the day.  12/22/2020, DO [facility]

## 2021-01-26 ENCOUNTER — Other Ambulatory Visit: Payer: Self-pay

## 2021-01-26 ENCOUNTER — Ambulatory Visit (INDEPENDENT_AMBULATORY_CARE_PROVIDER_SITE_OTHER): Payer: 59 | Admitting: Family Medicine

## 2021-01-26 VITALS — BP 162/80 | HR 90 | Ht 64.0 in | Wt 171.0 lb

## 2021-01-26 DIAGNOSIS — L989 Disorder of the skin and subcutaneous tissue, unspecified: Secondary | ICD-10-CM | POA: Diagnosis not present

## 2021-01-26 NOTE — Assessment & Plan Note (Signed)
Assessment: 45 year old female with 2 skin lesions which have been present for 7 to 8 years and have had 3 or 4 eruptions where they become bolus, painful, and pruritic before becoming more erythematous and then resolving to flat, darker colored circumferential areas as seen in the picture above.  Overall differential can include fixed drug eruption as she did recently start an NSAID just before the last episode and believes she may have taken doxycycline before previous episode.  Other differential can include clued pityriasis Rotonda versus discoid lupus.  The patient also has a history of hydradenitis which may be playing a role, though there is no palpable abscesses or masses underneath these lesions. Plan: -Punch biopsy performed, see note above -We will send for pathology and follow-up with patient with the results.

## 2021-01-26 NOTE — Patient Instructions (Addendum)
We performed a punch biopsy today.  We should get results back from pathology in the next 1-2 weeks.  If you do not hear anything in the next 2 weeks please let us know.  The biopsy site should heal on its own within the next couple of weeks or so, if you notice any drainage, erythema, warmth to the area please let us know.  If you have another outbreak of the rash please let us know and see if you can determine any medicines that you started taking right before that outbreak that might be related to it.

## 2021-01-26 NOTE — Progress Notes (Signed)
SUBJECTIVE:   CHIEF COMPLAINT / HPI:   Rash: Patient is 45 year old female that presents today for a rash for which she has been evaluated several times and states she has had on and off for the past several years.  She states she has had this rash 3 or 4 times over the past 7-8 years.  She recently did go to urgent care and was given an NSAID just before this most recent outbreak.  She thinks that she possibly had an outbreak after doxycycline in the past.  The outbreaks always happen in the exact same spots, 1 on her left lower abdomen and 1 on the underside of her right arm.  These lesions never fully healed or resolved and at their best appear similar to the previous photos in her chart.  She has tried a steroid cream as well as an antifungal neither of which gave her much relief.  These lesions sometimes blister and hurt when they are at their worst but have only done so about 3 or 4 times over the past several years.  PERTINENT  PMH / PSH: History of hidradenitis  OBJECTIVE:   BP (!) 162/80   Pulse 90   Ht 5\' 4"  (1.626 m)   Wt 171 lb (77.6 kg)   SpO2 98%   BMI 29.35 kg/m    General: NAD, pleasant, able to participate in exam Derm: Left lower abdomen: Flat 4 cm discoid lesion which is darker than the surrounding skin with no erythema, nonraised borders, no palpable masses underneath.  Right underside of upper arm: Similar 4 cm discoid lesion with no erythema, nonraised borders, no palpable masses underneath, no drainage.  These are nonpainful to palpation and are not pruritic at this time   Procedure note: Punch biopsy: Informed consent was gathered and was signed by the patient to place into the chart.  The area was cleaned with 2 Betadine swabs followed by alcohol swab.  Approximately 3 cc of lidocaine were injected into the center of the lesion on the patient's left lower abdomen.  3 mm punch biopsy was then used to obtain a sample from the center of the lesion.  Sterile  tweezers and scissors were used to remove the core biopsy sample and it was placed in a pathology jar.  Bleeding was stopped with silver nitrate and a bandage was placed.  The patient tolerated the procedure well with no complications.   ASSESSMENT/PLAN:   Skin lesion Assessment: 45 year old female with 2 skin lesions which have been present for 7 to 8 years and have had 3 or 4 eruptions where they become bolus, painful, and pruritic before becoming more erythematous and then resolving to flat, darker colored circumferential areas as seen in the picture above.  Overall differential can include fixed drug eruption as she did recently start an NSAID just before the last episode and believes she may have taken doxycycline before previous episode.  Other differential can include clued pityriasis Rotonda versus discoid lupus.  The patient also has a history of hydradenitis which may be playing a role, though there is no palpable abscesses or masses underneath these lesions. Plan: -Punch biopsy performed, see note above -We will send for pathology and follow-up with patient with the results.     55, DO [facility]    This note was prepared using Dragon voice recognition software and may include unintentional dictation errors due to the inherent limitations of voice recognition software.

## 2021-02-06 ENCOUNTER — Encounter: Payer: Self-pay | Admitting: Family Medicine

## 2021-04-04 ENCOUNTER — Emergency Department (HOSPITAL_COMMUNITY): Payer: Worker's Compensation

## 2021-04-04 ENCOUNTER — Emergency Department (HOSPITAL_COMMUNITY)
Admission: EM | Admit: 2021-04-04 | Discharge: 2021-04-04 | Disposition: A | Discharge status: Home / Self Care | Payer: Worker's Compensation | Attending: Emergency Medicine | Admitting: Emergency Medicine

## 2021-04-04 ENCOUNTER — Encounter (HOSPITAL_COMMUNITY): Payer: Self-pay | Admitting: *Deleted

## 2021-04-04 DIAGNOSIS — W208XXA Other cause of strike by thrown, projected or falling object, initial encounter: Secondary | ICD-10-CM | POA: Insufficient documentation

## 2021-04-04 DIAGNOSIS — I10 Essential (primary) hypertension: Secondary | ICD-10-CM | POA: Insufficient documentation

## 2021-04-04 DIAGNOSIS — Y990 Civilian activity done for income or pay: Secondary | ICD-10-CM | POA: Insufficient documentation

## 2021-04-04 DIAGNOSIS — Z79899 Other long term (current) drug therapy: Secondary | ICD-10-CM | POA: Insufficient documentation

## 2021-04-04 DIAGNOSIS — M546 Pain in thoracic spine: Secondary | ICD-10-CM | POA: Diagnosis present

## 2021-04-04 DIAGNOSIS — F17210 Nicotine dependence, cigarettes, uncomplicated: Secondary | ICD-10-CM | POA: Insufficient documentation

## 2021-04-04 MED ORDER — KETOROLAC TROMETHAMINE 30 MG/ML IJ SOLN
30.0000 mg | Freq: Once | INTRAMUSCULAR | Status: AC
  Administered 2021-04-04: 30 mg via INTRAMUSCULAR
  Filled 2021-04-04: qty 1

## 2021-04-04 MED ORDER — METHOCARBAMOL 500 MG PO TABS: 500.0000 mg | ORAL_TABLET | Freq: Two times a day (BID) | ORAL | 0 refills | Status: DC

## 2021-04-04 MED ORDER — LIDOCAINE 5 % EX PTCH
1.0000 | MEDICATED_PATCH | Freq: Once | CUTANEOUS | Status: DC
  Administered 2021-04-04: 1 via TRANSDERMAL
  Filled 2021-04-04: qty 1

## 2021-04-04 NOTE — ED Triage Notes (Signed)
To ED for eval after having a door fall on her while at work. 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 Pt states she walked a few steps prior to having to sit down. Now with left side neck pain, left arm pain, and left leg pain. Sitting in a wheelchair without movement feels better to her.

## 2021-04-04 NOTE — Discharge Instructions (Addendum)
Your x-rays today are reassuring.  Likely soft tissue injury.  Use over-the-counter Salonpas lidocaine patches, naproxen and Tylenol as needed for pain.  Can use Robaxin as needed for muscle spasm, this can cause drowsiness.  You can use ice and heat as well.  Follow-up with your PCP if symptoms not improving.

## 2021-04-04 NOTE — ED Provider Notes (Signed)
[facility] EMERGENCY DEPARTMENT Provider Note   CSN: [id number] Arrival date & time: 04/04/21  1320     History Chief Complaint  Patient presents with   Back Pain    Anne Gonzalez is a 45 y.o. female.  Anne Gonzalez is a 45 y.o. female with hx of HTN, migraines, irregular heart beat, GERD, who presents for evaluation of injury after a door fell on her. 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 The door did not hit her head or neck. She reports she had to sit down immediately when it happened and felt that it knocked the wind out of her but she did not lose consciousness. No anterior chest or abdominal pain, no shortness of breath. No numbness, weakness or tingling in extremities. Reports now pain has eased off some but reports if feels like her back is getting tight nd spasming. No meds prior to arrival.  The history is provided by the patient.      Past Medical History:  Diagnosis Date   Acid reflux 2005   Environmental allergies 2019   Hypertension    Irregular heart beat 2019   Migraine 2019   Sleep apnea 2019    Patient Active Problem List   Diagnosis Date Noted   Perimenopause 09/01/2020   Abnormal menstruation 08/04/2020   Migraine 05/22/2020   HLD (hyperlipidemia) 05/22/2020   Heartburn 11/27/2019   Hidradenitis 10/20/2019   Tobacco use disorder 10/20/2019   Skin lesion 10/17/2018   Essential hypertension 04/14/2018    Past Surgical History:  Procedure Laterality Date   THYROID SURGERY  2012   TUBAL LIGATION  2009     OB History   No obstetric history on file.     Family History  Problem Relation Age of Onset   Diabetes Father    Hypertension Father    Heart failure Father    Alcohol abuse Father    Drug abuse Father    Depression Father    Hyperlipidemia Father     Sickle cell anemia Sister     Social History   Tobacco Use   Smoking status: Every Day    Pack years: 0.00    Types: Cigarettes    Start date: 1990   Smokeless tobacco: Never  Substance Use Topics   Alcohol use: Yes    Comment: occasional wine   Drug use: No    Home Medications Prior to Admission medications   Medication Sig Start Date End Date Taking? Authorizing Provider  methocarbamol (ROBAXIN) 500 MG tablet Take 1 tablet (500 mg total) by mouth 2 (two) times daily. 04/04/21  Yes Dartha Lodge, PA-C  atorvastatin (LIPITOR) 20 MG tablet Take 1 tablet (20 mg total) by mouth daily. 05/20/20   Shirlean Mylar, MD  losartan (COZAAR) 100 MG tablet Take 1 tablet by mouth once daily 09/03/20   Shirlean Mylar, MD  naproxen (NAPROSYN) 500 MG tablet Take 1 tablet (500 mg total) by mouth 2 (two) times daily. 12/20/20   Ivette Loyal, NP  omeprazole (PRILOSEC) 20 MG capsule Take 1 capsule (20 mg total) by mouth daily. 05/20/20   Shirlean Mylar, MD  sulfamethoxazole-trimethoprim (BACTRIM DS) 800-160 MG tablet Take 1 tablet by mouth 2 (two) times daily. 10/06/20   Shirlean Mylar, MD  SUMAtriptan (IMITREX) 100  MG tablet Take 1 tablet (100 mg total) by mouth every 2 (two) hours as needed for migraine. May repeat in 2 hours if headache persists or recurs. 05/20/20   Shirlean Mylar, MD  triamcinolone ointment (KENALOG) 0.5 % Apply 1 application topically 2 (two) times daily. 01/16/21   Lilland, Alana, DO  verapamil (CALAN) 40 MG tablet TAKE 1 TABLET BY MOUTH THREE TIMES DAILY 12/16/20   Shirlean Mylar, MD    Allergies    Aspirin, Hctz [hydrochlorothiazide], Lisinopril, and Pineapple  Review of Systems   Review of Systems  Constitutional:  Negative for chills and fever.  HENT: Negative.    Respiratory:  Negative for shortness of breath.   Cardiovascular:  Negative for chest pain.  Gastrointestinal:  Negative for abdominal pain.  Musculoskeletal:  Positive for back pain and myalgias.  Negative for arthralgias.  Skin:  Negative for color change and wound.  Neurological:  Negative for dizziness, weakness, light-headedness, numbness and headaches.  All other systems reviewed and are negative.  Physical Exam Updated Vital Signs BP (!) 191/102   Pulse 66   Temp 98.9 F (37.2 C) (Oral)   Resp 17   LMP 03/17/2021   SpO2 99%   Physical Exam Vitals and nursing note reviewed.  Constitutional:      General: She is not in acute distress.    Appearance: Normal appearance. She is well-developed. She is not ill-appearing or diaphoretic.  HENT:     Head: Normocephalic and atraumatic.     Comments: No hematoma or signs of head trauma, no tenderness Neck:     Trachea: No tracheal deviation.     Comments: No midline c-spine tenderness, normal ROM Cardiovascular:     Rate and Rhythm: Normal rate and regular rhythm.     Heart sounds: Normal heart sounds.  Pulmonary:     Effort: Pulmonary effort is normal.     Breath sounds: Normal breath sounds. No stridor.     Comments: No anterior chest wall tenderness , crepitus or deformity, breath sounds present and equal bilaterally, left posterior rib tenderness without deformity Chest:     Chest wall: No tenderness.  Abdominal:     General: Bowel sounds are normal.     Palpations: Abdomen is soft.     Comments: No ecchymosis, NTTP in all quadrants, no flank tenderness  Musculoskeletal:     Cervical back: Neck supple.     Comments: No midline spinal tenderness, there is left thoracic back tenderness without palpable deformity or ecchymosis All joints supple, and easily moveable with no obvious deformity, all compartments soft  Skin:    General: Skin is warm and dry.     Capillary Refill: Capillary refill takes less than 2 seconds.     Comments: No ecchymosis, lacerations or abrasions  Neurological:     Mental Status: She is alert.     Comments: Speech is clear, able to follow commands Moves extremities without ataxia,  coordination intact    Psychiatric:        Mood and Affect: Mood normal.        Behavior: Behavior normal.    ED Results / Procedures / Treatments   Labs (all labs ordered are listed, but only abnormal results are displayed) Labs Reviewed - No data to display  EKG None  Radiology DG Ribs Unilateral W/Chest Left  Result Date: 04/04/2021 CLINICAL DATA:  posterior seventh and eighth rib pain.  Trauma. EXAM: LEFT RIBS AND CHEST - 3+ VIEW COMPARISON:  11/06/2017 FINDINGS:  Frontal view of the chest and three views of left-sided ribs. Midline trachea. Mild cardiomegaly. Mediastinal contours otherwise within normal limits. No pleural effusion or pneumothorax. Clear lungs. Rib radiographs demonstrate a marker over the the ninth posterolateral left rib. No displaced fracture IMPRESSION: 1. No displaced rib fracture, pleural fluid, or pneumothorax. 2. Mild cardiomegaly. Electronically Signed   By: Jeronimo Greaves M.D.   On: 04/04/2021 15:42    Procedures Procedures   Medications Ordered in ED Medications  lidocaine (LIDODERM) 5 % 1 patch (1 patch Transdermal Patch Applied 04/04/21 1654)  ketorolac (TORADOL) 30 MG/ML injection 30 mg (30 mg Intramuscular Given 04/04/21 1652)    ED Course  I have reviewed the triage vital signs and the nursing notes.  Pertinent labs & imaging results that were available during my care of the patient were reviewed by me and considered in my medical decision making (see chart for details).    MDM Rules/Calculators/A&P                         Pt with injury to the left back when door fell on her at work. No head injury or midline spinal tenderness. No anterior chest or abdominal tenderness, pain well localized to the left lower posterior ribs without deformity or crepitus. X-rays of left ribs and chest reviewed, no evidence of fracture or other acute abnormality. Likely soft tissue, recommend supportive treatment, follow and return precautions discussed, discharged  home in good condition.  Final Clinical Impression(s) / ED Diagnoses Final diagnoses:  Acute left-sided thoracic back pain    Rx / DC Orders ED Discharge Orders          Ordered    methocarbamol (ROBAXIN) 500 MG tablet  2 times daily        04/04/21 1708             Dartha Lodge, [place] 04/06/21 0146    Melene Plan, DO 04/07/21 [phone]

## 2021-04-04 NOTE — ED Provider Notes (Signed)
Emergency Medicine Provider Triage Evaluation Note  Anne Gonzalez , a 45 y.o. female  was evaluated in triage.  Pt complains of patient presents with pain on her left side after a door fell on her.  Door did not hit her head, she did not fall to the ground, she denies losing conscious, is not on anticoagulant.  Patient states she does not have any back pain but endorse that she has pain on her left ribs.  Has no other complaints at this time..  Review of Systems  Positive: Left rib pain Negative: Neck or back pain  Physical Exam  BP (!) 179/113 (BP Location: Right Arm)   Pulse 72   Temp 98.9 F (37.2 C) (Oral)   Resp (!) 21   SpO2 100%  Gen:   Awake, no distress   Resp:  Normal effort  MSK:   Moves extremities without difficulty  Other:    Medical Decision Making  Medically screening exam initiated at 2:34 PM.  Appropriate orders placed.  Anne Gonzalez was informed that the remainder of the evaluation will be completed by another provider, this initial triage assessment does not replace that evaluation, and the importance of remaining in the ED until their evaluation is complete.  Patient presents after a door fell onto her left side imaging have been ordered, patient need further work-up in the emergency department.   Carroll Sage, PA-C 04/04/21 1435    Gerhard Munch, MD 04/09/21 [phone]

## 2021-04-06 ENCOUNTER — Other Ambulatory Visit: Payer: Self-pay | Admitting: Family Medicine

## 2021-04-06 DIAGNOSIS — I10 Essential (primary) hypertension: Secondary | ICD-10-CM

## 2021-04-10 ENCOUNTER — Ambulatory Visit (INDEPENDENT_AMBULATORY_CARE_PROVIDER_SITE_OTHER): Payer: 59 | Admitting: Family Medicine

## 2021-04-10 ENCOUNTER — Other Ambulatory Visit: Payer: Self-pay

## 2021-04-10 ENCOUNTER — Encounter: Payer: Self-pay | Admitting: Family Medicine

## 2021-04-10 VITALS — BP 170/100 | HR 87 | Ht 65.0 in | Wt 163.0 lb

## 2021-04-10 DIAGNOSIS — M546 Pain in thoracic spine: Secondary | ICD-10-CM | POA: Diagnosis not present

## 2021-04-10 MED ORDER — CAPSAICIN 0.1 % EX CREA: TOPICAL_CREAM | CUTANEOUS | 0 refills | Status: DC

## 2021-04-10 MED ORDER — LIDOCAINE 0.5 % EX GEL: CUTANEOUS | 0 refills | Status: DC

## 2021-04-10 MED ORDER — IBUPROFEN 600 MG PO TABS: 600.0000 mg | ORAL_TABLET | Freq: Three times a day (TID) | ORAL | 0 refills | Status: DC | PRN

## 2021-04-10 NOTE — Progress Notes (Signed)
    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, it was not properly installed on hinges and therefore fell on her, catching her in her left thoracic back - Evaluated at ED 7/12 - At ED, no red flag symptoms, was tender to palpation, chest x-ray unremarkable - Sent home with Robaxin and told to return to work Friday - Patient returned to work, however felt too painful and requests letter for light duty   PERTINENT  PMH / PSH: HTN, migraine, hidradenitis suppurativa, HLD, perimenopause  OBJECTIVE:   BP (!) 170/100   Pulse 87   Ht 5\' 5"  (1.651 m)   Wt 163 lb (73.9 kg)   LMP 04/07/2021   SpO2 96%   BMI 27.12 kg/m    PHQ-9:  Depression screen [facility] 2/9 04/10/2021 01/16/2021 09/01/2020  Decreased Interest 1 0 0  Down, Depressed, Hopeless 1 0 0  PHQ - 2 Score 2 0 0  Altered sleeping 1 0 0  Tired, decreased energy 2 0 0  Change in appetite 2 0 0  Feeling bad or failure about yourself  0 0 0  Trouble concentrating 0 0 0  Moving slowly or fidgety/restless 0 0 0  Suicidal thoughts 0 0 0  PHQ-9 Score 7 0 0  Difficult doing work/chores - Not difficult at all -     GAD-7: No flowsheet data found.   Physical Exam General: Awake, alert, oriented MSK: Paraspinal and flank tenderness to palpation and slight swelling on left at level of T10-L2, no ecchymoses observed, no midline tenderness, no radiation Extremities: No bilateral lower extremity edema, equal strength bilaterally Neuro: Cranial nerves II through X grossly intact, able to move all extremities spontaneously   ASSESSMENT/PLAN:   Acute thoracic back pain No red flag symptoms.  Mild swelling present.  Strongly suspect MSK in nature.  Wrote letter for light duty at work x1 week.  Return precautions given.  - Prescribed ibuprofen 800 mg every 8 as needed for no longer than 1 week - Alternate Tylenol with ibuprofen - Prescribed capsaicin cream  and lidocaine cream to be tried separately - Try topical patches over-the-counter - Decline PT referral at this time due to acuity - Recommend range of motion gentle stretches - Heat and ice as needed     14/05/2020, MD [facility]

## 2021-04-10 NOTE — Assessment & Plan Note (Addendum)
No red flag symptoms.  Mild swelling present.  Strongly suspect MSK in nature.  Wrote letter for light duty at work x1 week.  Return precautions given.  - Prescribed ibuprofen 800 mg every 8 as needed for no longer than 1 week - Alternate Tylenol with ibuprofen - Prescribed capsaicin cream and lidocaine cream to be tried separately - Try topical patches over-the-counter - Decline PT referral at this time due to acuity - Recommend range of motion gentle stretches - Heat and ice as needed

## 2021-04-10 NOTE — Patient Instructions (Addendum)
It was wonderful to meet you today. Thank you for allowing me to be a part of your care. Below is a short summary of what we discussed at your visit today:  Thoracic back pain, left sided -Alternate Tylenol and ibuprofen - I have sent in a high-dose ibuprofen to your pharmacy.  Use this as needed for up to 1 week. - Continue using topical treatment such as lidocaine patches. - I have sent in lidocaine and capsaicin cream to your pharmacy.  Let me know which one works better and we can order you more. Use one or the other - DO NOT APPLY AT THE SAME TIME - Do some gentle range of motion stretches at home to slowly increase your mobility -Lets hold off on physical therapy for now.  If this does not continue to improve, we will revisit and possibly send you to physical therapy.   If you have any questions or concerns, please do not hesitate to contact us via phone or MyChart message.   Fayette Pho, MD

## 2021-04-12 ENCOUNTER — Ambulatory Visit: Payer: 59 | Admitting: Family Medicine

## 2021-04-13 ENCOUNTER — Other Ambulatory Visit: Payer: Self-pay

## 2021-04-13 ENCOUNTER — Encounter: Payer: Self-pay | Admitting: Family Medicine

## 2021-04-13 ENCOUNTER — Ambulatory Visit (INDEPENDENT_AMBULATORY_CARE_PROVIDER_SITE_OTHER): Payer: 59 | Admitting: Family Medicine

## 2021-04-13 DIAGNOSIS — F4321 Adjustment disorder with depressed mood: Secondary | ICD-10-CM | POA: Insufficient documentation

## 2021-04-13 DIAGNOSIS — M79605 Pain in left leg: Secondary | ICD-10-CM

## 2021-04-13 DIAGNOSIS — M545 Low back pain, unspecified: Secondary | ICD-10-CM | POA: Diagnosis not present

## 2021-04-13 DIAGNOSIS — M546 Pain in thoracic spine: Secondary | ICD-10-CM | POA: Diagnosis not present

## 2021-04-13 HISTORY — DX: Pain in left leg: M79.605

## 2021-04-13 HISTORY — DX: Low back pain, unspecified: M54.50

## 2021-04-13 MED ORDER — SERTRALINE HCL 50 MG PO TABS: 50.0000 mg | ORAL_TABLET | Freq: Every day | ORAL | 3 refills | Status: DC

## 2021-04-13 NOTE — Progress Notes (Signed)
    SUBJECTIVE:   CHIEF COMPLAINT / HPI:   Back injury at work. Please also see ED visit of 7/12 and office visit of 7/18.  Struck in left lower thoracic/upper lumbar back by falling, unhinged door on 7/12.  Now has severe bilateral lumbar pain Left>right.  No change in bowel or bladder.  Does have some mild tingling of her left foot.   Treatment thus far has been heat, Motrin and robaxin.  She was also prescribed topical lidocaine and Capsecum but in has not come into pharmacy yet.   No one has ordered PT - holding off on workman's comp, which has not contacted her.  Tried to work light duty and could not due to pain.  Denies urgency and frequency and fever.  She has a remote hx of more right sided low back pain.   Denies pregnancy.    Very stressful time.  Just bought and moved into new house.  Single mother.  Now not working.  Only previous time of "depression" was post partum years ago. Zoloft previously worked well for her.      OBJECTIVE:   BP (!) 168/102   Pulse 89   Ht 5\' 5"  (1.651 m)   Wt 164 lb (74.4 kg)   LMP 04/07/2021   SpO2 98%   BMI 27.29 kg/m   Pain is in Left CVA region.  Radiates down to buttocks bilaterally. Normal gait, motor and sensory. Affect - at times teary in offce when describing stressors.  ASSESSMENT/PLAN:   No problem-specific Assessment & Plan notes found for this encounter.     04/09/2021, MD [facility]

## 2021-04-13 NOTE — Patient Instructions (Signed)
I will give you a work excuse letter. I ordered regular Xrays and physical therapy. I want you seen back here in 10-14 days. I sent in some zoloft for you.   Good luck.

## 2021-04-13 NOTE — Assessment & Plan Note (Signed)
Injury comes at a very bad time causing considerable stress.  After discussing, we jointly agreed to start zoloft, which previously worked quite well for her.

## 2021-04-13 NOTE — Assessment & Plan Note (Signed)
I would now characterize as more lumbar rather than thoracic pain.  Will check LS spine series.  Refer to PT.

## 2021-04-13 NOTE — Assessment & Plan Note (Signed)
Again, I see this as more lumbar pain at this time.

## 2021-04-17 ENCOUNTER — Telehealth: Payer: Self-pay

## 2021-04-17 DIAGNOSIS — M546 Pain in thoracic spine: Secondary | ICD-10-CM

## 2021-04-17 MED ORDER — LIDOCAINE 4 % EX GEL: CUTANEOUS | 0 refills | Status: DC

## 2021-04-17 NOTE — Telephone Encounter (Signed)
Received call from pharmacy regarding lidocaine gel prescription. Per pharmacist, medication is only carried in 4% or 5 %. Please send updated prescription if appropriate.   Veronda Prude, RN

## 2021-04-19 ENCOUNTER — Other Ambulatory Visit: Payer: Self-pay

## 2021-04-19 DIAGNOSIS — M546 Pain in thoracic spine: Secondary | ICD-10-CM

## 2021-04-19 MED ORDER — LIDOCAINE 4 % EX GEL: CUTANEOUS | 0 refills | Status: DC

## 2021-05-03 ENCOUNTER — Ambulatory Visit: Payer: 59 | Admitting: Family Medicine

## 2021-07-30 ENCOUNTER — Other Ambulatory Visit: Payer: Self-pay | Admitting: Family Medicine

## 2021-07-30 DIAGNOSIS — I10 Essential (primary) hypertension: Secondary | ICD-10-CM

## 2021-07-30 DIAGNOSIS — E782 Mixed hyperlipidemia: Secondary | ICD-10-CM

## 2021-07-30 DIAGNOSIS — K219 Gastro-esophageal reflux disease without esophagitis: Secondary | ICD-10-CM

## 2021-10-15 ENCOUNTER — Other Ambulatory Visit: Payer: Self-pay | Admitting: Family Medicine

## 2021-10-24 ENCOUNTER — Encounter: Payer: Self-pay | Admitting: Family Medicine

## 2021-10-24 DIAGNOSIS — L732 Hidradenitis suppurativa: Secondary | ICD-10-CM

## 2021-10-25 MED ORDER — SULFAMETHOXAZOLE-TRIMETHOPRIM 800-160 MG PO TABS: 1.0000 | ORAL_TABLET | Freq: Two times a day (BID) | ORAL | 1 refills | Status: DC

## 2022-01-05 ENCOUNTER — Telehealth: Payer: Self-pay

## 2022-01-05 ENCOUNTER — Other Ambulatory Visit: Payer: Self-pay

## 2022-01-05 ENCOUNTER — Encounter (HOSPITAL_COMMUNITY): Payer: Self-pay

## 2022-01-05 ENCOUNTER — Other Ambulatory Visit (HOSPITAL_BASED_OUTPATIENT_CLINIC_OR_DEPARTMENT_OTHER): Payer: Self-pay

## 2022-01-05 ENCOUNTER — Ambulatory Visit (HOSPITAL_COMMUNITY)
Admission: EM | Admit: 2022-01-05 | Discharge: 2022-01-05 | Disposition: A | Discharge status: Home / Self Care | Payer: Self-pay

## 2022-01-05 ENCOUNTER — Encounter (HOSPITAL_BASED_OUTPATIENT_CLINIC_OR_DEPARTMENT_OTHER): Payer: Self-pay | Admitting: Emergency Medicine

## 2022-01-05 ENCOUNTER — Emergency Department (HOSPITAL_BASED_OUTPATIENT_CLINIC_OR_DEPARTMENT_OTHER)
Admission: EM | Admit: 2022-01-05 | Discharge: 2022-01-05 | Disposition: A | Discharge status: Home / Self Care | Payer: Self-pay | Attending: Emergency Medicine | Admitting: Emergency Medicine

## 2022-01-05 DIAGNOSIS — I10 Essential (primary) hypertension: Secondary | ICD-10-CM | POA: Insufficient documentation

## 2022-01-05 DIAGNOSIS — Z79899 Other long term (current) drug therapy: Secondary | ICD-10-CM | POA: Insufficient documentation

## 2022-01-05 DIAGNOSIS — R519 Headache, unspecified: Secondary | ICD-10-CM

## 2022-01-05 LAB — COMPREHENSIVE METABOLIC PANEL
ALT: 10 U/L (ref 0–44)
AST: 12 U/L — ABNORMAL LOW (ref 15–41)
Albumin: 4.2 g/dL (ref 3.5–5.0)
Alkaline Phosphatase: 56 U/L (ref 38–126)
Anion gap: 8 (ref 5–15)
BUN: 13 mg/dL (ref 6–20)
CO2: 25 mmol/L (ref 22–32)
Calcium: 9.7 mg/dL (ref 8.9–10.3)
Chloride: 107 mmol/L (ref 98–111)
Creatinine, Ser: 0.93 mg/dL (ref 0.44–1.00)
GFR, Estimated: >60 mL/min (ref >60)
Glucose, Bld: 94 mg/dL (ref 70–99)
Potassium: 3.4 mmol/L — ABNORMAL LOW (ref 3.5–5.1)
Sodium: 140 mmol/L (ref 135–145)
Total Bilirubin: 0.5 mg/dL (ref 0.3–1.2)
Total Protein: 7.7 g/dL (ref 6.5–8.1)

## 2022-01-05 LAB — CBC WITH DIFFERENTIAL/PLATELET
Abs Immature Granulocytes: 0.05 10*3/uL (ref 0.00–0.07)
Basophils Absolute: 0 10*3/uL (ref 0.0–0.1)
Basophils Relative: 0 %
Eosinophils Absolute: 0.1 10*3/uL (ref 0.0–0.5)
Eosinophils Relative: 1 %
HCT: 39.1 % (ref 36.0–46.0)
Hemoglobin: 13 g/dL (ref 12.0–15.0)
Immature Granulocytes: 1 %
Lymphocytes Relative: 24 %
Lymphs Abs: 2.3 10*3/uL (ref 0.7–4.0)
MCH: 30.4 pg (ref 26.0–34.0)
MCHC: 33.2 g/dL (ref 30.0–36.0)
MCV: 91.6 fL (ref 80.0–100.0)
Monocytes Absolute: 0.7 10*3/uL (ref 0.1–1.0)
Monocytes Relative: 7 %
Neutro Abs: 6.5 10*3/uL (ref 1.7–7.7)
Neutrophils Relative %: 67 %
Platelets: 329 10*3/uL (ref 150–400)
RBC: 4.27 MIL/uL (ref 3.87–5.11)
RDW: 13.1 % (ref 11.5–15.5)
WBC: 9.7 10*3/uL (ref 4.0–10.5)
nRBC: 0 % (ref 0.0–0.2)

## 2022-01-05 LAB — TROPONIN I (HIGH SENSITIVITY): Troponin I (High Sensitivity): 5 ng/L (ref <18)

## 2022-01-05 MED ORDER — METOCLOPRAMIDE HCL 5 MG/ML IJ SOLN
10.0000 mg | Freq: Once | INTRAMUSCULAR | Status: AC
  Administered 2022-01-05: 10 mg via INTRAVENOUS
  Filled 2022-01-05: qty 2

## 2022-01-05 MED ORDER — DIPHENHYDRAMINE HCL 50 MG/ML IJ SOLN
25.0000 mg | Freq: Once | INTRAMUSCULAR | Status: AC
  Administered 2022-01-05: 25 mg via INTRAVENOUS
  Filled 2022-01-05: qty 1

## 2022-01-05 MED ORDER — LOSARTAN POTASSIUM 100 MG PO TABS
100.0000 mg | ORAL_TABLET | Freq: Every day | ORAL | 0 refills | Status: DC
  Filled 2022-01-05: qty 30, 30d supply, fill #0

## 2022-01-05 MED ORDER — LOSARTAN POTASSIUM 25 MG PO TABS
100.0000 mg | ORAL_TABLET | Freq: Once | ORAL | Status: AC
  Administered 2022-01-05: 100 mg via ORAL
  Filled 2022-01-05: qty 4

## 2022-01-05 MED ORDER — HYDRALAZINE HCL 20 MG/ML IJ SOLN
5.0000 mg | Freq: Once | INTRAMUSCULAR | Status: AC
  Administered 2022-01-05: 5 mg via INTRAVENOUS
  Filled 2022-01-05: qty 1

## 2022-01-05 NOTE — ED Notes (Signed)
Patient is being discharged from the Urgent Care and sent to the Emergency Department via POV . Per Dr.Piontek, patient is in need of higher level of care due to HTN. Patient is aware and verbalizes understanding of plan of care.  ?Vitals:  ? 01/05/22 1138  ?BP: (!) 184/124  ?Pulse: 89  ?Resp: 14  ?Temp: 98.1 ?F (36.7 ?C)  ?SpO2: 100%  ?  ?

## 2022-01-05 NOTE — Telephone Encounter (Signed)
Patient calls nurse line reporting she has been out of Losartan for ~ 1 week.  ? ?Patient reports she lost her insurance and has not been able to make a FU apt with PCP. ? ?Patient reports she was feeling "unwell" yesterday and took her blood pressure at work ~213/121. ? ?Patient reports she woke up this morning with a headache and blurred vision.  ? ?Patient denies SOB or chests pains at this time.  ? ?Patient is unable to take her blood pressure as she is at home.  ? ?Patient advised with recent BP readings associated with symptoms she would need to go to UC.  ? ?Patient agreed with plan.  ?

## 2022-01-05 NOTE — ED Notes (Signed)
EMT-P provided AVS using Teachback Method. Patient verbalizes understanding of Discharge Instructions. Opportunity for Questioning and Answers were provided by EMT-P. Patient Discharged from ED.  ? ?

## 2022-01-05 NOTE — ED Provider Notes (Signed)
Patient seen today for elevated blood pressure.  Out of her med for about a week.  Having elevated pressures, severe headache, blurred vision and dizziness.   ?Discussed that given her symptoms she needs to go to the ER for treatment and evaluation.  ?She is aware and agrees.  ?  ?Anne Franklin, MD ?01/05/22 1147 ? ?

## 2022-01-05 NOTE — ED Triage Notes (Signed)
Pt states she is out of her HTN meds for 1.5 weeks. Endorses headache this week. Pt takes losartan. PCP sent her to UC due to pt needing medical checkup before more medications ordered.  ?

## 2022-01-05 NOTE — ED Triage Notes (Signed)
Pt presents with HTN and migraine x1 week. Pt states she has been out of medication x1.5 weeks. ?

## 2022-01-05 NOTE — ED Provider Notes (Addendum)
?[facility] EMERGENCY DEPT ?Provider Note ? ? ?CSN: [id number] ?Arrival date & time: 01/05/22  1221 ? ?  ? ?History ? ?Chief Complaint  ?Patient presents with  ? Hypertension  ? ? ?Anne Gonzalez is a 46 y.o. female with pertinent history of hypertension, GERD, migraine headaches, sleep apnea.  Presents to the emergency department with a chief complaint of headache and high blood pressure. ? ?Patient reports that she has been out of her blood pressure medication for 1.5 weeks.  Patient states that she takes 100 mg losartan once daily.  Patient attempted to get medication refilled today however was advised to go to the emergency department due to concerns of high blood pressure, headache, and blurred vision. ? ?Patient states that she has had headaches every day of this week.  Patient wakes up pain-free however headache gradually developed throughout the day and pain becomes progressively worse.  Pain is located behind her right eye.  Patient reports that she does have a history of migraine headaches.  Has not had a migraine headache in years.  She states that this headache feels similar to previous migraine she has had in the past.  Patient endorses a visual aura to her right eye.  States that she sees a white halo that becomes worse and disrupts her vision.  Patient does endorse blurry vision to her right eye.  Patient took Imitrex medication yesterday with resolution of her headache and aura.   ? ?Patient denies any fever, chills, numbness, weakness, facial asymmetry, dysarthria, neck pain, neck stiffness, chest pain, palpitations, shortness of breath, syncope. ? ? ? ? ?Hypertension ?Associated symptoms include headaches. Pertinent negatives include no chest pain, no abdominal pain and no shortness of breath.  ? ?  ? ?Home Medications ?Prior to Admission medications   ?Medication Sig Start Date End Date Taking? Authorizing Provider  ?atorvastatin (LIPITOR) 20 MG tablet Take 1 tablet by mouth  once daily 07/31/21   Shirlean MylarMahoney, Caitlin, MD  ?Capsaicin (CAPSAICIN HP) 0.1 % CREA Apply to painful left back area as needed for pain. Wash hands afterwards. ?Patient not taking: Reported on 01/05/2022 04/10/21   Fayette PhoLynn, Catherine, MD  ?ibuprofen (ADVIL) 600 MG tablet Take 1 tablet (600 mg total) by mouth every 8 (eight) hours as needed. ?Patient not taking: Reported on 01/05/2022 04/10/21   Fayette PhoLynn, Catherine, MD  ?Lidocaine 4 % GEL Apply to painful area every 6 hours as needed. ?Patient not taking: Reported on 01/05/2022 04/19/21   Shirlean MylarMahoney, Caitlin, MD  ?losartan (COZAAR) 100 MG tablet Take 1 tablet (100 mg total) by mouth daily. Pt needs appt for more refills. 10/16/21   Shirlean MylarMahoney, Caitlin, MD  ?methocarbamol (ROBAXIN) 500 MG tablet Take 1 tablet (500 mg total) by mouth 2 (two) times daily. ?Patient not taking: Reported on 01/05/2022 04/04/21   Dartha LodgeFord, Kelsey N, PA-C  ?omeprazole (PRILOSEC) 20 MG capsule Take 1 capsule by mouth once daily 07/31/21   Shirlean MylarMahoney, Caitlin, MD  ?sertraline (ZOLOFT) 50 MG tablet Take 1 tablet (50 mg total) by mouth daily. 04/13/21   Moses MannersHensel, William A, MD  ?sulfamethoxazole-trimethoprim (BACTRIM DS) 800-160 MG tablet Take 1 tablet by mouth 2 (two) times daily. ?Patient not taking: Reported on 01/05/2022 10/25/21   Shirlean MylarMahoney, Caitlin, MD  ?SUMAtriptan (IMITREX) 100 MG tablet Take 1 tablet (100 mg total) by mouth every 2 (two) hours as needed for migraine. May repeat in 2 hours if headache persists or recurs. 05/20/20   Shirlean MylarMahoney, Caitlin, MD  ?verapamil (CALAN) 40 MG tablet TAKE 1  TABLET BY MOUTH THREE TIMES DAILY. NEED APPOINTMENT FOR REFILLS 07/31/21   Shirlean Mylar, MD  ?   ? ?Allergies    ?Aspirin, Hctz [hydrochlorothiazide], Lisinopril, Pineapple, and Doxycycline   ? ?Review of Systems   ?Review of Systems  ?Constitutional:  Negative for chills and fever.  ?Eyes:  Positive for visual disturbance.  ?Respiratory:  Negative for shortness of breath.   ?Cardiovascular:  Negative for chest pain.  ?Gastrointestinal:   Negative for abdominal pain, nausea and vomiting.  ?Genitourinary:  Negative for difficulty urinating and dysuria.  ?Musculoskeletal:  Negative for back pain, neck pain and neck stiffness.  ?Skin:  Negative for color change and rash.  ?Neurological:  Positive for headaches. Negative for dizziness, tremors, seizures, syncope, facial asymmetry, speech difficulty, weakness, light-headedness and numbness.  ?Psychiatric/Behavioral:  Negative for confusion.   ? ?Physical Exam ?Updated Vital Signs ?BP (!) 192/119 (BP Location: Right Arm)   Pulse 89   Temp 98.5 ?F (36.9 ?C)   Resp 18   Ht 5\' 3"  (1.6 m)   Wt 77.1 kg   SpO2 99%   BMI 30.11 kg/m?  ?Physical Exam ?Vitals and nursing note reviewed.  ?Constitutional:   ?   General: She is not in acute distress. ?   Appearance: She is not ill-appearing, toxic-appearing or diaphoretic.  ?Eyes:  ?   General: No visual field deficit.    ?   Right eye: No discharge.     ?   Left eye: No discharge.  ?   Extraocular Movements: Extraocular movements intact.  ?   Conjunctiva/sclera: Conjunctivae normal.  ?   Pupils: Pupils are equal, round, and reactive to light.  ?Cardiovascular:  ?   Rate and Rhythm: Normal rate.  ?   Heart sounds: Normal heart sounds, S1 normal and S2 normal. Heart sounds not distant. No murmur heard. ?Pulmonary:  ?   Effort: Pulmonary effort is normal. No tachypnea or bradypnea.  ?   Breath sounds: Normal breath sounds. No stridor.  ?Abdominal:  ?   General: Abdomen is protuberant. There is no distension. There are no signs of injury.  ?   Palpations: Abdomen is soft. There is no mass or pulsatile mass.  ?   Tenderness: There is no abdominal tenderness. There is no guarding or rebound.  ?Musculoskeletal:  ?   Cervical back: Normal range of motion and neck supple. No rigidity.  ?   Right lower leg: Normal.  ?   Left lower leg: Normal.  ?Skin: ?   General: Skin is warm and dry.  ?Neurological:  ?   General: No focal deficit present.  ?   Mental Status: She is  alert and oriented to person, place, and time.  ?   GCS: GCS eye subscore is 4. GCS verbal subscore is 5. GCS motor subscore is 6.  ?   Cranial Nerves: No cranial nerve deficit, dysarthria or facial asymmetry.  ?   Sensory: Sensation is intact.  ?   Motor: No weakness, tremor, seizure activity or pronator drift.  ?   Coordination: Romberg sign negative. Finger-Nose-Finger Test normal.  ?   Gait: Gait is intact. Gait normal.  ?   Comments: CN II-XII intact, equal grip strength, +5 strength to bilateral upper and lower extremities, station to the touch grossly intact to bilateral upper and lower extremities.  ?Psychiatric:     ?   Behavior: Behavior is cooperative.  ? ? ?ED Results / Procedures / Treatments   ?Labs ?(all labs  ordered are listed, but only abnormal results are displayed) ?Labs Reviewed  ?COMPREHENSIVE METABOLIC PANEL - Abnormal; Notable for the following components:  ?    Result Value  ? Potassium 3.4 (*)   ? AST 12 (*)   ? All other components within normal limits  ?CBC WITH DIFFERENTIAL/PLATELET  ?TROPONIN I (HIGH SENSITIVITY)  ? ? ?EKG ?None ? ?Radiology ?No results found. ? ?Procedures ?Procedures  ? ? ?Medications Ordered in ED ?Medications  ?metoCLOPramide (REGLAN) injection 10 mg (10 mg Intravenous Given 01/05/22 1417)  ?diphenhydrAMINE (BENADRYL) injection 25 mg (25 mg Intravenous Given 01/05/22 1414)  ?losartan (COZAAR) tablet 100 mg (100 mg Oral Given 01/05/22 1422)  ?hydrALAZINE (APRESOLINE) injection 5 mg (5 mg Intravenous Given 01/05/22 1423)  ? ? ?ED Course/ Medical Decision Making/ A&P ?  ?                        ?Medical Decision Making ?Amount and/or Complexity of Data Reviewed ?Labs: ordered. ? ?Risk ?Prescription drug management. ? ? ?46 year old female in no acute distress, nontoxic appearing with a chief complaint of headache and hypertension. ? ?Information obtained from patient.  Past medical records were reviewed including previous provider note, labs, and imaging.  Patient has  history as outlined in HPI which complicates care. ? ?With pain being similar to previous migraines we will hold any imaging at this time.  Plan to give patient migraine cocktail, hydralazine, and losartan.  Will check

## 2022-01-05 NOTE — Discharge Instructions (Addendum)
You came to the emergency department today to be evaluated for your high blood pressure and headache.  Your physical exam and lab results were reassuring.  I have given you prescription for your blood pressure medication.  Please continue to take this as prescribed.  Please follow-up closely with your primary care doctor for repeat evaluation. ? ?Due to your reports of migraines I placed a ambulatory referral to neurology.  They should contact you in 3-5 business days.  If you do not hear from them please use the information on this paperwork to call and schedule follow-up appointment. ? ?Get help right away if you: ?Develop a severe headache or confusion. ?Have unusual weakness or numbness. ?Feel faint. ?Have severe pain in your chest or abdomen. ?Vomit repeatedly. ?Have trouble breathing. ?

## 2022-01-19 ENCOUNTER — Ambulatory Visit (INDEPENDENT_AMBULATORY_CARE_PROVIDER_SITE_OTHER): Payer: Self-pay | Admitting: Family Medicine

## 2022-01-19 ENCOUNTER — Other Ambulatory Visit (HOSPITAL_COMMUNITY): Payer: Self-pay

## 2022-01-19 ENCOUNTER — Encounter: Payer: Self-pay | Admitting: Family Medicine

## 2022-01-19 VITALS — BP 153/107 | HR 84 | Ht 63.0 in | Wt 168.4 lb

## 2022-01-19 DIAGNOSIS — Z131 Encounter for screening for diabetes mellitus: Secondary | ICD-10-CM

## 2022-01-19 DIAGNOSIS — L732 Hidradenitis suppurativa: Secondary | ICD-10-CM

## 2022-01-19 DIAGNOSIS — Z0000 Encounter for general adult medical examination without abnormal findings: Secondary | ICD-10-CM

## 2022-01-19 DIAGNOSIS — K219 Gastro-esophageal reflux disease without esophagitis: Secondary | ICD-10-CM

## 2022-01-19 DIAGNOSIS — I10 Essential (primary) hypertension: Secondary | ICD-10-CM

## 2022-01-19 DIAGNOSIS — F4321 Adjustment disorder with depressed mood: Secondary | ICD-10-CM

## 2022-01-19 DIAGNOSIS — E782 Mixed hyperlipidemia: Secondary | ICD-10-CM

## 2022-01-19 MED ORDER — LOSARTAN POTASSIUM 100 MG PO TABS: 100.0000 mg | ORAL_TABLET | Freq: Every day | ORAL | 3 refills | Status: DC

## 2022-01-19 MED ORDER — OMEPRAZOLE 20 MG PO CPDR: 20.0000 mg | DELAYED_RELEASE_CAPSULE | Freq: Every day | ORAL | 2 refills | Status: DC

## 2022-01-19 MED ORDER — VERAPAMIL HCL 80 MG PO TABS: 80.0000 mg | ORAL_TABLET | Freq: Two times a day (BID) | ORAL | 3 refills | Status: DC

## 2022-01-19 MED ORDER — SERTRALINE HCL 50 MG PO TABS: 50.0000 mg | ORAL_TABLET | Freq: Every day | ORAL | 3 refills | Status: DC

## 2022-01-19 NOTE — Patient Instructions (Signed)
It was a pleasure to see you today! ? ?Consider applying for the orange card to get colonoscopy and mammograms completed ?We will get some labs today.  If they are abnormal or we need to do something about them, I will call you.  If they are normal, I will send you a message on MyChart (if it is active) or a letter in the mail.  If you don't hear from [place] in 2 weeks, please call the office  [phone]. ?Increase verapamil to 80 mg twice a day and check your blood pressure every day at the same time for a week and write it down. We will check in via phone in 1 week to see how your numbers are doing. ?Consider rheumatology referral for HS for new medications to control flares ? ?Be Well, ? ?Dr. Leary Roca ? ?

## 2022-01-19 NOTE — Progress Notes (Signed)
    SUBJECTIVE:   CHIEF COMPLAINT / HPI:   Hypertension: Not at goal, but improved: 150s/100s. Losartan 100 mg, verapamil 40 mg TID. She reports that she has had elevated BP to 180s/100s at home, sometimes has headaches, though does have a h/o migraines. She was initially started on verapamil as she was unable to tolerate other CCBs, supposed to return for up titration, but difficulty with this due to work schedule and no insurance. Had swelling with HCTZ and amlodipine, also headaches, dry cough with lisinopril. She reports that she is very adherent to taking verapamil in the morning and at night, but usually forgets the mid day dosing due to work schedule.   HLD: check lipid panel and CMP.  Screen for DM2, A1c order placed.   HS: She currently does not have a flare. Patient interested in other options for treatment, she has had 7 flares since the start of 2023. She has silvadene and bactroban, usually does bactrim for flares. Recommend if she is interested that she can see derm/rheum for evaluation for biologic treatment. Patient is overweight with BMI 29, but not obese.  PERTINENT  PMH / PSH: Hypertension, HS  OBJECTIVE:   BP (!) 153/107   Pulse 84   Ht 5\' 3"  (1.6 m)   Wt 168 lb 6.4 oz (76.4 kg)   LMP 01/10/2022   SpO2 100%   BMI 29.83 kg/m   Nursing note and vitals reviewed GEN: age-appropriate, AAW, resting comfortably in chair, NAD, WNWD HEENT: NCAT. PERRLA. Sclera without injection or icterus. MMM.  Neck: Supple. No LAD. Cardiac: Regular rate and rhythm. Normal S1/S2. No murmurs, rubs, or gallops appreciated. 2+ radial pulses. Lungs: Clear bilaterally to ascultation. No increased WOB, no accessory muscle usage. No w/r/r. Neuro: AOx3  Ext: no edema Psych: Pleasant and appropriate  ASSESSMENT/PLAN:   HLD (hyperlipidemia) Intermediate ASCVD risk at 11.7%, recommend moderate intensity statin, atorvastatin 40mg .  Hidradenitis Patient has tried appropriate conservative  measures, prefers referral to rheum- placed, can also consider derm referral if rheum declines.  Essential hypertension Chronic, not at goal. Continue losartan. Increase verapamil to 80 mg BID for ease of dosing. Can increase to 120 mg if goal not reached. Follow up in 2 weeks.   Healthcare maintenance A1c WNL at 5%.     01/12/2022, MD [facility]

## 2022-01-20 LAB — LIPID PANEL
Chol/HDL Ratio: 3.9 ratio (ref 0.0–4.4)
Cholesterol, Total: 258 mg/dL — ABNORMAL HIGH (ref 100–199)
HDL: 66 mg/dL (ref >39)
LDL Chol Calc (NIH): 175 mg/dL — ABNORMAL HIGH (ref 0–99)
Triglycerides: 96 mg/dL (ref 0–149)
VLDL Cholesterol Cal: 17 mg/dL (ref 5–40)

## 2022-01-20 LAB — HEMOGLOBIN A1C
Est. average glucose Bld gHb Est-mCnc: 97 mg/dL
Hgb A1c MFr Bld: 5 % (ref 4.8–5.6)

## 2022-01-24 DIAGNOSIS — Z0000 Encounter for general adult medical examination without abnormal findings: Secondary | ICD-10-CM | POA: Insufficient documentation

## 2022-01-24 NOTE — Assessment & Plan Note (Signed)
Chronic, not at goal. Continue losartan. Increase verapamil to 80 mg BID for ease of dosing. Can increase to 120 mg if goal not reached. Follow up in 2 weeks.  ?

## 2022-01-24 NOTE — Assessment & Plan Note (Signed)
A1c WNL at 5%. ?

## 2022-01-24 NOTE — Assessment & Plan Note (Signed)
Patient has tried appropriate conservative measures, prefers referral to rheum- placed, can also consider derm referral if rheum declines. ?

## 2022-01-24 NOTE — Assessment & Plan Note (Signed)
Intermediate ASCVD risk at 11.7%, recommend moderate intensity statin, atorvastatin 40mg . ?

## 2022-01-26 ENCOUNTER — Other Ambulatory Visit: Payer: Self-pay | Admitting: Family Medicine

## 2022-01-26 DIAGNOSIS — L732 Hidradenitis suppurativa: Secondary | ICD-10-CM

## 2022-02-21 ENCOUNTER — Encounter: Payer: Self-pay | Admitting: Family Medicine

## 2022-02-27 ENCOUNTER — Encounter: Payer: Self-pay | Admitting: *Deleted

## 2022-03-17 ENCOUNTER — Encounter: Payer: Self-pay | Admitting: Family Medicine

## 2022-04-26 ENCOUNTER — Other Ambulatory Visit (HOSPITAL_COMMUNITY): Payer: Self-pay

## 2022-04-27 ENCOUNTER — Other Ambulatory Visit (HOSPITAL_COMMUNITY): Payer: Self-pay

## 2022-06-18 ENCOUNTER — Encounter (HOSPITAL_BASED_OUTPATIENT_CLINIC_OR_DEPARTMENT_OTHER): Payer: Self-pay | Admitting: Emergency Medicine

## 2022-06-18 ENCOUNTER — Emergency Department (HOSPITAL_BASED_OUTPATIENT_CLINIC_OR_DEPARTMENT_OTHER)
Admission: EM | Admit: 2022-06-18 | Discharge: 2022-06-18 | Disposition: A | Discharge status: Home / Self Care | Payer: Self-pay | Attending: Emergency Medicine | Admitting: Emergency Medicine

## 2022-06-18 ENCOUNTER — Other Ambulatory Visit: Payer: Self-pay

## 2022-06-18 DIAGNOSIS — R519 Headache, unspecified: Secondary | ICD-10-CM

## 2022-06-18 DIAGNOSIS — G43909 Migraine, unspecified, not intractable, without status migrainosus: Secondary | ICD-10-CM | POA: Insufficient documentation

## 2022-06-18 LAB — CBC
HCT: 41.4 % (ref 36.0–46.0)
Hemoglobin: 13.9 g/dL (ref 12.0–15.0)
MCH: 31.3 pg (ref 26.0–34.0)
MCHC: 33.6 g/dL (ref 30.0–36.0)
MCV: 93.2 fL (ref 80.0–100.0)
Platelets: 427 10*3/uL — ABNORMAL HIGH (ref 150–400)
RBC: 4.44 MIL/uL (ref 3.87–5.11)
RDW: 12.9 % (ref 11.5–15.5)
WBC: 14.1 10*3/uL — ABNORMAL HIGH (ref 4.0–10.5)
nRBC: 0 % (ref 0.0–0.2)

## 2022-06-18 LAB — BASIC METABOLIC PANEL
Anion gap: 14 (ref 5–15)
BUN: 9 mg/dL (ref 6–20)
CO2: 22 mmol/L (ref 22–32)
Calcium: 10 mg/dL (ref 8.9–10.3)
Chloride: 101 mmol/L (ref 98–111)
Creatinine, Ser: 0.96 mg/dL (ref 0.44–1.00)
GFR, Estimated: >60 mL/min (ref >60)
Glucose, Bld: 146 mg/dL — ABNORMAL HIGH (ref 70–99)
Potassium: 3.5 mmol/L (ref 3.5–5.1)
Sodium: 137 mmol/L (ref 135–145)

## 2022-06-18 LAB — HCG, SERUM, QUALITATIVE: Preg, Serum: NEGATIVE

## 2022-06-18 MED ORDER — DIPHENHYDRAMINE HCL 50 MG/ML IJ SOLN
25.0000 mg | Freq: Once | INTRAMUSCULAR | Status: AC
  Administered 2022-06-18: 25 mg via INTRAVENOUS
  Filled 2022-06-18: qty 1

## 2022-06-18 MED ORDER — MAGNESIUM SULFATE 2 GM/50ML IV SOLN
2.0000 g | Freq: Once | INTRAVENOUS | Status: AC
  Administered 2022-06-18: 2 g via INTRAVENOUS
  Filled 2022-06-18: qty 50

## 2022-06-18 MED ORDER — DEXAMETHASONE 4 MG PO TABS
10.0000 mg | ORAL_TABLET | Freq: Once | ORAL | Status: AC
  Administered 2022-06-18: 10 mg via ORAL
  Filled 2022-06-18: qty 3

## 2022-06-18 MED ORDER — KETOROLAC TROMETHAMINE 15 MG/ML IJ SOLN
15.0000 mg | Freq: Once | INTRAMUSCULAR | Status: AC
  Administered 2022-06-18: 15 mg via INTRAVENOUS
  Filled 2022-06-18: qty 1

## 2022-06-18 MED ORDER — DROPERIDOL 2.5 MG/ML IJ SOLN
1.2500 mg | Freq: Once | INTRAMUSCULAR | Status: AC
  Administered 2022-06-18: 1.25 mg via INTRAVENOUS
  Filled 2022-06-18: qty 2

## 2022-06-18 MED ORDER — SODIUM CHLORIDE 0.9 % IV BOLUS
1000.0000 mL | Freq: Once | INTRAVENOUS | Status: AC
  Administered 2022-06-18: 1000 mL via INTRAVENOUS

## 2022-06-18 NOTE — ED Provider Notes (Signed)
[id number] EMERGENCY DEPT Provider Note   CSN: [id number] Arrival date & time: 06/18/22  1552     History  Chief Complaint  Patient presents with   Migraine    Anne Gonzalez is a 45 y.o. female.  46 yo F with a chief complaint of a headache.  Patient has a history of migraines and this feels the same.  Started with an aura and then developed her typical pain over her right eye.  She tried Imitrex at home but without improvement.  Since then has had excruciating pain nausea and vomiting.  She denies one-sided numbness or weakness denies difficulty speech or swallowing denies fevers denies trauma.-   Migraine       Home Medications Prior to Admission medications   Medication Sig Start Date End Date Taking? Authorizing Provider  atorvastatin (LIPITOR) 20 MG tablet Take 1 tablet by mouth once daily 07/31/21   Shirlean Mylar, MD  Capsaicin (CAPSAICIN HP) 0.1 % CREA Apply to painful left back area as needed for pain. Wash hands afterwards. Patient not taking: Reported on 01/05/2022 04/10/21   Fayette Pho, MD  ibuprofen (ADVIL) 600 MG tablet Take 1 tablet (600 mg total) by mouth every 8 (eight) hours as needed. Patient not taking: Reported on 01/05/2022 04/10/21   Fayette Pho, MD  Lidocaine 4 % GEL Apply to painful area every 6 hours as needed. Patient not taking: Reported on 01/05/2022 04/19/21   Shirlean Mylar, MD  losartan (COZAAR) 100 MG tablet Take 1 tablet (100 mg total) by mouth daily. 01/19/22   Shirlean Mylar, MD  methocarbamol (ROBAXIN) 500 MG tablet Take 1 tablet (500 mg total) by mouth 2 (two) times daily. Patient not taking: Reported on 01/05/2022 04/04/21   Dartha Lodge, PA-C  omeprazole (PRILOSEC) 20 MG capsule Take 1 capsule (20 mg total) by mouth daily. 01/19/22   Shirlean Mylar, MD  sertraline (ZOLOFT) 50 MG tablet Take 1 tablet (50 mg total) by mouth daily. 01/19/22   Shirlean Mylar, MD  sulfamethoxazole-trimethoprim (BACTRIM DS)  800-160 MG tablet Take 1 tablet by mouth 2 (two) times daily. Patient not taking: Reported on 01/05/2022 10/25/21   Shirlean Mylar, MD  SUMAtriptan (IMITREX) 100 MG tablet Take 1 tablet (100 mg total) by mouth every 2 (two) hours as needed for migraine. May repeat in 2 hours if headache persists or recurs. 05/20/20   Shirlean Mylar, MD  verapamil (CALAN) 80 MG tablet Take 1 tablet (80 mg total) by mouth 2 (two) times daily. 01/19/22   Shirlean Mylar, MD      Allergies    Aspirin, Hctz [hydrochlorothiazide], Lisinopril, Pineapple, and Doxycycline    Review of Systems   Review of Systems  Physical Exam Updated Vital Signs BP (!) 224/117   Pulse 68   Temp 98.2 F (36.8 C)   Resp 18   Ht 5\' 3"  (1.6 m)   Wt 76.4 kg   SpO2 98%   BMI 29.84 kg/m  Physical Exam Vitals and nursing note reviewed.  Constitutional:      General: She is not in acute distress.    Appearance: She is well-developed. She is not diaphoretic.  HENT:     Head: Normocephalic and atraumatic.  Eyes:     Pupils: Pupils are equal, round, and reactive to light.  Cardiovascular:     Rate and Rhythm: Normal rate and regular rhythm.     Heart sounds: No murmur heard.    No friction rub. No gallop.  Pulmonary:  Effort: Pulmonary effort is normal.     Breath sounds: No wheezing or rales.  Abdominal:     General: There is no distension.     Palpations: Abdomen is soft.     Tenderness: There is no abdominal tenderness.  Musculoskeletal:        General: No tenderness.     Cervical back: Normal range of motion and neck supple.  Skin:    General: Skin is warm and dry.  Neurological:     Mental Status: She is alert and oriented to person, place, and time.     Cranial Nerves: Cranial nerves 2-12 are intact.     Comments: Exam limited by pain.  No obvious neurologic deficit noted.  Photophobia  Psychiatric:        Behavior: Behavior normal.     ED Results / Procedures / Treatments   Labs (all labs ordered  are listed, but only abnormal results are displayed) Labs Reviewed  CBC - Abnormal; Notable for the following components:      Result Value   WBC 14.1 (*)    Platelets 427 (*)    All other components within normal limits  BASIC METABOLIC PANEL - Abnormal; Notable for the following components:   Glucose, Bld 146 (*)    All other components within normal limits  HCG, SERUM, QUALITATIVE    EKG None  Radiology No results found.  Procedures Procedures    Medications Ordered in ED Medications  droperidol (INAPSINE) 2.5 MG/ML injection 1.25 mg (1.25 mg Intravenous Given 06/18/22 1659)  diphenhydrAMINE (BENADRYL) injection 25 mg (25 mg Intravenous Given 06/18/22 1644)  sodium chloride 0.9 % bolus 1,000 mL (0 mLs Intravenous Stopped 06/18/22 1818)  dexamethasone (DECADRON) tablet 10 mg (10 mg Oral Given 06/18/22 1754)  ketorolac (TORADOL) 15 MG/ML injection 15 mg (15 mg Intravenous Given 06/18/22 1755)  magnesium sulfate IVPB 2 g 50 mL (0 g Intravenous Stopped 06/18/22 1818)    ED Course/ Medical Decision Making/ A&P                           Medical Decision Making Amount and/or Complexity of Data Reviewed Labs: ordered.  Risk Prescription drug management.   46 yo F with a chief complaints of a headache nausea and vomiting.  Going on since this morning.  Patient has a history of migraines and this feels similar.  No obvious neurologic deficits on initial exam.  We will treat with a headache cocktail.  Bolus of IV fluids.  Reassess.  Through the triage process patient had blood work ordered.  She has a mild leukocytosis likely secondary to vomiting.  The patient reassessed and feeling quite a bit better.  She would like something else for her headache.  We will give mag and Toradol.  Reassess.  Patient feeling better on reassessment and would like to be discharged home.  Will give neurology follow-up.  6:42 PM:  I have discussed the diagnosis/risks/treatment options with the  patient.  Evaluation and diagnostic testing in the emergency department does not suggest an emergent condition requiring admission or immediate intervention beyond what has been performed at this time.  They will follow up with  PCP, neuro. We also discussed returning to the ED immediately if new or worsening sx occur. We discussed the sx which are most concerning (e.g., sudden worsening pain, fever, inability to tolerate by mouth) that necessitate immediate return. Medications administered to the patient during their visit and  any new prescriptions provided to the patient are listed below.  Medications given during this visit Medications  droperidol (INAPSINE) 2.5 MG/ML injection 1.25 mg (1.25 mg Intravenous Given 06/18/22 1659)  diphenhydrAMINE (BENADRYL) injection 25 mg (25 mg Intravenous Given 06/18/22 1644)  sodium chloride 0.9 % bolus 1,000 mL (0 mLs Intravenous Stopped 06/18/22 1818)  dexamethasone (DECADRON) tablet 10 mg (10 mg Oral Given 06/18/22 1754)  ketorolac (TORADOL) 15 MG/ML injection 15 mg (15 mg Intravenous Given 06/18/22 1755)  magnesium sulfate IVPB 2 g 50 mL (0 g Intravenous Stopped 06/18/22 1818)     The patient appears reasonably screen and/or stabilized for discharge and I doubt any other medical condition or other [facility] requiring further screening, evaluation, or treatment in the ED at this time prior to discharge.          Final Clinical Impression(s) / ED Diagnoses Final diagnoses:  Right-sided headache    Rx / DC Orders ED Discharge Orders          Ordered    Ambulatory referral to Neurology  Status:  Canceled       Comments: Headache syndrome   06/18/22 1840    Ambulatory referral to Neurology       Comments: Headache syndrome   06/18/22 1841              Melene Plan, DO 06/18/22 1842

## 2022-06-18 NOTE — ED Triage Notes (Addendum)
Pt arrives to ED via [facility] with c/o migraine that started x1 hour ago.

## 2022-06-18 NOTE — Discharge Instructions (Addendum)
Please follow-up with neurologist in the office for this.  Take your medication that you are prescribed for your headaches if you have a headache.  Do not take it to try and prevent a headache because this could cause 1.

## 2022-07-24 ENCOUNTER — Inpatient Hospital Stay: Payer: Self-pay | Admitting: Psychiatry

## 2022-07-24 ENCOUNTER — Encounter: Payer: Self-pay | Admitting: Psychiatry

## 2022-07-24 NOTE — Progress Notes (Deleted)
Referring:  Melene Plan, DO [address] [place],  [place] [address]  PCP: Celine Mans, MD  Neurology was asked to evaluate Anne Gonzalez, a 46 year old female for a chief complaint of headaches.  Our recommendations of care will be communicated by shared medical record.    CC:  headaches  History provided from ***  HPI:  Medical co-morbidities: HTN, HLD  The patient presents for evaluation of headaches which began***  Headache History: Onset: Triggers: Aura: Location: Quality/Description: Associated Symptoms:  Photophobia:  Phonophobia:  Nausea: yes Vomiting: yes Allodynia: Other symptoms: Worse with activity?: Duration of headaches:  Headache days per month: *** Migraine days per month: *** Headache free days per month: ***  Current Treatment: Abortive Imitrex 100 mg PRN  Preventative ***  Prior Therapies                                 ibuprofen Imitrex 100 mg PRN Robaxin 500 mg PRN Verapamil 80 mg BID Losartan 100 mg daily Zoloft 50 mg daily   LABS: ***  IMAGING:  ***  ***Imaging independently reviewed on July 24, 2022   Current Outpatient Medications on File Prior to Visit  Medication Sig Dispense Refill   atorvastatin (LIPITOR) 20 MG tablet Take 1 tablet by mouth once daily 30 tablet 3   Capsaicin (CAPSAICIN HP) 0.1 % CREA Apply to painful left back area as needed for pain. Wash hands afterwards. (Patient not taking: Reported on 01/05/2022) 42.5 g 0   ibuprofen (ADVIL) 600 MG tablet Take 1 tablet (600 mg total) by mouth every 8 (eight) hours as needed. (Patient not taking: Reported on 01/05/2022) 60 tablet 0   Lidocaine 4 % GEL Apply to painful area every 6 hours as needed. (Patient not taking: Reported on 01/05/2022) 113 g 0   losartan (COZAAR) 100 MG tablet Take 1 tablet (100 mg total) by mouth daily. 90 tablet 3   methocarbamol (ROBAXIN) 500 MG tablet Take 1 tablet (500 mg total) by mouth 2 (two) times daily. (Patient not taking:  Reported on 01/05/2022) 20 tablet 0   omeprazole (PRILOSEC) 20 MG capsule Take 1 capsule (20 mg total) by mouth daily. 30 capsule 2   sertraline (ZOLOFT) 50 MG tablet Take 1 tablet (50 mg total) by mouth daily. 30 tablet 3   sulfamethoxazole-trimethoprim (BACTRIM DS) 800-160 MG tablet Take 1 tablet by mouth 2 (two) times daily. (Patient not taking: Reported on 01/05/2022) 10 tablet 1   SUMAtriptan (IMITREX) 100 MG tablet Take 1 tablet (100 mg total) by mouth every 2 (two) hours as needed for migraine. May repeat in 2 hours if headache persists or recurs. 30 tablet 1   verapamil (CALAN) 80 MG tablet Take 1 tablet (80 mg total) by mouth 2 (two) times daily. 180 tablet 3   No current facility-administered medications on file prior to visit.     Allergies: Allergies  Allergen Reactions   Aspirin    Hctz [Hydrochlorothiazide] Swelling and Other (See Comments)    Causes swelling and headaches    Lisinopril Cough   Pineapple    Doxycycline Rash    Family History: Migraine or other headaches in the family:  *** Aneurysms in a first degree relative:  *** Brain tumors in the family:  *** Other neurological illness in the family:   ***  Past Medical History: Past Medical History:  Diagnosis Date   Acid reflux 2005  Environmental allergies 2019   Hypertension    Irregular heart beat 2019   Migraine 2019   Sleep apnea 2019    Past Surgical History Past Surgical History:  Procedure Laterality Date   THYROID SURGERY  2012   TUBAL LIGATION  2009    Social History: Social History   Tobacco Use   Smoking status: Every Day    Types: Cigarettes    Start date: 1990   Smokeless tobacco: Never  Substance Use Topics   Alcohol use: Yes    Comment: occasional wine   Drug use: No   ***  ROS: Negative for fevers, chills. Positive for***. All other systems reviewed and negative unless stated otherwise in HPI.   Physical Exam:   Vital Signs: There were no vitals taken for this  visit. GENERAL: well appearing,in no acute distress,alert SKIN:  Color, texture, turgor normal. No rashes or lesions HEAD:  Normocephalic/atraumatic. CV:  RRR RESP: Normal respiratory effort MSK: no tenderness to palpation over occiput, neck, or shoulders  NEUROLOGICAL: Mental Status: Alert, oriented to person, place and time,Follows commands Cranial Nerves: PERRL, visual fields intact to confrontation, extraocular movements intact, facial sensation intact, no facial droop or ptosis, hearing grossly intact, no dysarthria, palate elevate symmetrically, tongue protrudes midline, shoulder shrug intact and symmetric Motor: muscle strength 5/5 both upper and lower extremities,no drift, normal tone Reflexes: 2+ throughout Sensation: intact to light touch all 4 extremities Coordination: Finger-to- nose-finger intact bilaterally Gait: normal-based   IMPRESSION: ***  PLAN: ***   I spent a total of *** minutes chart reviewing and counseling the patient. Headache education was done. Discussed treatment options including preventive and acute medications, natural supplements, and physical therapy. Discussed medication overuse headache and to limit use of acute treatments to no more than 2 days/week or 10 days/month. Discussed medication side effects, adverse reactions and drug interactions. Written educational materials and patient instructions outlining all of the above were given.  Follow-up: ***   Genia Harold, MD 07/24/2022   9:15 AM

## 2022-11-09 ENCOUNTER — Ambulatory Visit (INDEPENDENT_AMBULATORY_CARE_PROVIDER_SITE_OTHER): Payer: Self-pay | Admitting: Family Medicine

## 2022-11-09 DIAGNOSIS — I10 Essential (primary) hypertension: Secondary | ICD-10-CM

## 2022-11-09 MED ORDER — LOSARTAN POTASSIUM 100 MG PO TABS: 100.0000 mg | ORAL_TABLET | Freq: Every day | ORAL | 2 refills | Status: DC

## 2022-11-09 MED ORDER — VERAPAMIL HCL ER 120 MG PO TBCR: 120.0000 mg | EXTENDED_RELEASE_TABLET | Freq: Every day | ORAL | 2 refills | Status: DC

## 2022-11-09 NOTE — Patient Instructions (Signed)
It was wonderful to see you today. Thank you for allowing me to be a part of your care. Below is a short summary of what we discussed at your visit today:  Blood pressure I have sent medications to the [place] you prefer on [facility]. Start losartan 100 mg daily.  Since you have already had 1 dose today, started Saturday 2/17. Start verapamil ER 120 mg daily.  You may start that tonight.  Check your blood pressure every day.  Come back to clinic Monday or Tuesday for recheck.  We will likely get another lab, a BMP, and make sure your blood pressure is appropriate.  Reasons to go to the emergency room: Facial drooping Slurred words Weakness of arms or legs Dropping things Stumbling or walking funny Worst headache of your life   If you have any questions or concerns, please do not hesitate to contact us via phone or MyChart message.   Ezequiel Essex, MD

## 2022-11-09 NOTE — Progress Notes (Signed)
    SUBJECTIVE:   CHIEF COMPLAINT / HPI:   HTN Anne Gonzalez presents today for uncontrolled hypertension. She reports she was previously on Losasrtan 100 and Verapamil 80, but has been out of medications for a couple months due to finances related to being in between jobs.   She now is working at [employer] and went to work today with a bad right-sided headache. Does have migraines with aura at baseline - this headache feels similar. Took Extra strength Excedrin x2 this AM before going into work.   At work, coworkers found her BP to be 223/117. She was given losartan 100 mg and sent here to evaluation.   Denies speech abnormalities, facial drooping, acute vision changes (although gradual changes x1 mo), grip weakness, or weakness of arms/legs.   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   Acute thoracic back pain 04/10/2021   Perimenopause 09/01/2020   Abnormal menstruation 08/04/2020   Migraine 05/22/2020   HLD (hyperlipidemia) 05/22/2020   Heartburn 11/27/2019   Hidradenitis 10/20/2019   Tobacco use disorder 10/20/2019   Skin lesion 10/17/2018   Essential hypertension 04/14/2018    OBJECTIVE:   BP (!) 160/98   Pulse 87   Wt 183 lb 3.2 oz (83.1 kg)   LMP 10/08/2022 (Approximate)   SpO2 95%   BMI 32.45 kg/m    PHQ-9:     11/09/2022    2:53 PM 04/13/2021   10:19 AM 04/10/2021    3:44 PM  Depression screen PHQ 2/9  Decreased Interest 0 1 1  Down, Depressed, Hopeless 0 1 1  PHQ - 2 Score 0 2 2  Altered sleeping 0 1 1  Tired, decreased energy 0 2 2  Change in appetite 0 2 2  Feeling bad or failure about yourself  0 0 0  Trouble concentrating 0 0 0  Moving slowly or  fidgety/restless 0 0 0  Suicidal thoughts 0 0 0  PHQ-9 Score 0 7 7  Difficult doing work/chores Not difficult at all      Physical Exam General: Awake, alert, oriented Cardiovascular: Regular rate and rhythm, S1 and S2 present, no murmurs auscultated Respiratory: Lung fields clear to auscultation bilaterally Neuro: Cranial nerves II through X grossly intact, able to move all extremities spontaneously, equal smile, strength 5/5 and equal bilaterally of grip/BUE/BLE  ASSESSMENT/PLAN:   Essential hypertension Uncontrolled HTN. Options somewhat limited by finances as her new insurance cards have not yet arrived. Will prescribe meds on the $4 [facility] list. Restart previous regimen of losartan and verapamil.  - Losartan 100 mg daily - Verapamil 120 mg daily (prev dose of 80 mg not on $4 list) - Return in one week for recheck and BMP - return and ED precautions discussed, see AVS for more     Anne Essex, MD [place]

## 2022-11-13 ENCOUNTER — Ambulatory Visit (INDEPENDENT_AMBULATORY_CARE_PROVIDER_SITE_OTHER): Payer: Self-pay | Admitting: Student

## 2022-11-13 VITALS — BP 164/94 | HR 90

## 2022-11-13 DIAGNOSIS — I10 Essential (primary) hypertension: Secondary | ICD-10-CM

## 2022-11-13 MED ORDER — VERAPAMIL HCL ER 180 MG PO TBCR: 180.0000 mg | EXTENDED_RELEASE_TABLET | Freq: Every day | ORAL | 0 refills | Status: DC

## 2022-11-13 NOTE — Patient Instructions (Signed)
We are going to increase your verapamil to 131m ER daily. Come back on Friday for a BP check. Send me a MTherapist, musicif you get your insurance card and we have other options.  In the meantime, if you develop vision changes, headache, shortness of breath, or chest pain, you ought to go to the ED as these could be signs of a hypertensive emergency.   JMarnee Guarneri MD

## 2022-11-13 NOTE — Progress Notes (Addendum)
    SUBJECTIVE:   CHIEF COMPLAINT / HPI:   Elevated BP Longstanding hypertension difficult to control due to multiple drug sensitivities.  Most recently had decent control on verapamil 80 mg twice daily and losartan 100 daily.  However, due to a recent lapse in insurance coverage issues and challenges, and to transition off of the immediate release verapamil and onto alternatives from the [facility] $4 formulary.  The best that she was able to hear was losartan 100 mg daily and verapamil extended release 120 mg daily.  See Dr. Arby Barrette note from 11/09/22. Unfortunately, she has not had blood pressure quite complex regimen.  Her BP at work today was 223/117.  She walked over here from her place of work to be seen urgently due to this elevated reading.  While here, she is 164/98.  She denies any change in her vision, headache, shortness of breath, or chest pain.  No new swelling in her legs. Unfortunately has sensitivities to both amlodipine and HCTZ which I would usually reach for as options for her.  OBJECTIVE:   BP (!) 164/94   Pulse 90   LMP 10/08/2022 (Approximate)   SpO2 96%   Physical Exam Constitutional:      General: She is not in acute distress. HENT:     Mouth/Throat:     Mouth: Mucous membranes are moist.  Cardiovascular:     Rate and Rhythm: Normal rate and regular rhythm.     Heart sounds: No murmur heard. Pulmonary:     Effort: Pulmonary effort is normal. No respiratory distress.     Breath sounds: No wheezing, rhonchi or rales.  Musculoskeletal:     Right lower leg: No edema.     Left lower leg: No edema.  Skin:    General: Skin is warm and dry.  Neurological:     General: No focal deficit present.     Sensory: No sensory deficit.     Motor: No weakness.     Gait: Gait normal.  Psychiatric:        Mood and Affect: Mood normal.      ASSESSMENT/PLAN:   Essential hypertension Poorly controlled on current regimen. Thankfully asymptomatic and with a benign cardiac,  pulmonary, and neuro exam. Not concerned for hypertensive urgency at this time. Need better control, but limited in our options by her many sensitivities and challenging financial situation. For now, will try increasing her Verapamil CR to 117m nightly as this is also on the [facility] $4 formulary. Continue Losartan 1046mdaily. - Strict ER precautions reviewed for HA, SOB, CP, sudden swelling, or vision changes  - Return on Friday for RN visit BP check      J BaPearla DubonnetMD [place]

## 2022-11-13 NOTE — Addendum Note (Signed)
Addended by: Jim Like B on: 11/13/2022 04:39 PM   Modules accepted: Level of Service

## 2022-11-13 NOTE — Assessment & Plan Note (Addendum)
Poorly controlled on current regimen. Thankfully asymptomatic and with a benign cardiac, pulmonary, and neuro exam. Not concerned for hypertensive urgency at this time. Need better control, but limited in our options by her many sensitivities and challenging financial situation. For now, will try increasing her Verapamil CR to 158m nightly as this is also on the [facility] $4 formulary. Continue Losartan 106mdaily. - Strict ER precautions reviewed for HA, SOB, CP, sudden swelling, or vision changes  - Return on Friday for RN visit BP check

## 2022-11-16 ENCOUNTER — Ambulatory Visit: Payer: Self-pay

## 2022-11-16 DIAGNOSIS — I10 Essential (primary) hypertension: Secondary | ICD-10-CM

## 2022-11-16 NOTE — Progress Notes (Signed)
Patient here today for BP check.      Last BP was on 11/13/2022 and was 164/92.  BP today is 152/96 with a pulse of 90 after sitting for 15 minutes.   Checked BP in left arm with large cuff.    Symptoms present: None.   Patient last took BP med Losartan today and Verapamil last night as prescribed.   Patient advised to monitor her BP throughout the weekend.  Strict ED precautions given.   Patient scheduled for Monday to see a provider.

## 2022-11-18 NOTE — Assessment & Plan Note (Addendum)
Uncontrolled HTN. Options somewhat limited by finances as her new insurance cards have not yet arrived. Will prescribe meds on the $4 [facility] list. Restart previous regimen of losartan and verapamil.  - Losartan 100 mg daily - Verapamil 120 mg daily (prev dose of 80 mg not on $4 list) - Return in one week for recheck and BMP - return and ED precautions discussed, see AVS for more

## 2022-11-18 NOTE — Patient Instructions (Addendum)
It was wonderful to see you today.  Please bring ALL of your medications with you to every visit.   Today we talked about:  I have refilled your medications.  Your blood pressure is still elevated. We are increasing your Verapamil to the 240 mg dose- I have sent this to the pharmacy. Continue to take your Losartan daily as well. I am also sending in another medication called Metoprolol which may help [place] a little with your blood pressure and may work to help prevent migraines as well.  I will see you in 2 weeks for a blood pressure recheck!  Thank you for coming to your visit as scheduled. We have had a large "no-show" problem lately, and this significantly limits our ability to see and care for patients. As a friendly reminder- if you cannot make your appointment please call to cancel. We do have a no show policy for those who do not cancel within 24 hours. Our policy is that if you miss or fail to cancel an appointment within 24 hours, 3 times in a 66-month period, you may be dismissed from our clinic.   Thank you for choosing [facility].   Please call [phone] with any questions about today's appointment.  Please be sure to schedule follow up at the front  desk before you leave today.   Sabino Dick, DO PGY-3 Family Medicine

## 2022-11-18 NOTE — Progress Notes (Signed)
SUBJECTIVE:   CHIEF COMPLAINT / HPI:   Blood Pressure F/U Anne Gonzalez is a 47 y.o. female who presents to the clinic today for follow up on blood pressure. She was last seen in the office on 2/23 for a nurse visit to recheck BP. Her BP at that time was 152/96. Patient has had adverse reactions to multiple previous antihypertensives as noted on allergy list (amlodipine- swelling even at 5 mg, HCTZ causes swelling and headaches, lisinopril causes cough). She has also had financial and insurance barriers. For these reasons, her current medications include Verapamil 180 mg, Losartan 100 mg. Reports good compliance. See below for blood pressure cuffs- she does have a wrist cuff. Denies chest pain, shortness of breath, lower extremity edema. Does endorse intermittent headaches (has hx of migraines and no HA currently), and vision changes (she is unsure if this is related to an outdated glasses prescription).  Her headaches are mainly over her right eye. They occur about 3-4 times a month (once a month). She does have an aura associated.   Home blood pressure readings Today, 2/26: 190/93 Yesterday, 2/25: 164/101 Saturday 2/24: 178/112 Friday AM 2/23: 176/108 Friday PM 2/23: 152/96  BP Readings from Last 3 Encounters:  11/19/22 (!) 155/109  11/13/22 (!) 164/94  11/09/22 (!) 160/98   PERTINENT  PMH / PSH:  Past Medical History:  Diagnosis Date   Acid reflux 2005   Environmental allergies 2019   Hypertension    Irregular heart beat 2019   Migraine 2019   Sleep apnea 2019   OBJECTIVE:   BP (!) 155/109   Pulse 97   Ht 5\' 3"  (1.6 m)   Wt 182 lb 12.8 oz (82.9 kg)   LMP 10/08/2022 (Approximate)   SpO2 99%   BMI 32.38 kg/m   Vitals:   11/19/22 1617 11/19/22 1649  BP: (!) 155/109 (!) 164/104  Pulse: 97   SpO2: 99%    General: NAD, pleasant, able to participate in exam Cardiac: RRR, no murmurs. Respiratory: CTAB, normal effort, No wheezes, rales or rhonchi Abdomen: Obese   Extremities: no edema or cyanosis. Psych: Normal affect and mood     11/19/2022    4:18 PM 11/09/2022    2:53 PM 04/13/2021   10:19 AM  Depression screen PHQ 2/9  Decreased Interest 0 0 1  Down, Depressed, Hopeless 0 0 1  PHQ - 2 Score 0 0 2  Altered sleeping 0 0 1  Tired, decreased energy 0 0 2  Change in appetite 0 0 2  Feeling bad or failure about yourself  0 0 0  Trouble concentrating 0 0 0  Moving slowly or fidgety/restless 0 0 0  Suicidal thoughts 0 0 0  PHQ-9 Score 0 0 7  Difficult doing work/chores  Not difficult at all    ASSESSMENT/PLAN:   1. Essential hypertension Still elevated today on 2 checks.  I do not suspect that her headaches are associated with her hypertension as she has no history of migraines and they seem consistent with migraines. No current HA today and no focal deficits to suggest acute neurological concern. She has noted intolerances to Lisinopril, Amlodipine, and HCTZ. Has room to increase on her Verapamil so will increase today. Will also start metoprolol- as this is moderately lipophilic it may also help with migraine prevention as well as slight BP control.  - verapamil (CALAN-SR) 240 MG CR tablet; Take 1 tablet (240 mg total) by mouth at bedtime.  Dispense: 30 tablet;  Refill: 0 - metoprolol succinate (TOPROL-XL) 25 MG 24 hr tablet; Take 1 tablet (25 mg total) by mouth at bedtime.  Dispense: 90 tablet; Refill: 3 - Return in 2 weeks for BP recheck  - BMP is UTD, next due 05/2023  2. Mixed hyperlipidemia Refilled. Due for repeat labs in April. - atorvastatin (LIPITOR) 20 MG tablet; Take 1 tablet (20 mg total) by mouth daily.  Dispense: 30 tablet; Refill: 3  3. Gastroesophageal reflux disease, unspecified whether esophagitis present Refilled as patient has run out and now has insurance. - omeprazole (PRILOSEC) 20 MG capsule; Take 1 capsule (20 mg total) by mouth daily.  Dispense: 30 capsule; Refill: 2  4. Adjustment reaction with brief depressive  reaction PHQ-9 is negative. She was previously on sertraline and it was helping her greatly.  She would like to be restarted now that she has insurance. - sertraline (ZOLOFT) 50 MG tablet; Take 1 tablet (50 mg total) by mouth daily.  Dispense: 30 tablet; Refill: 3  5. Hidradenitis suppurativa Reports uses PRN for flares. She would like a refill. - sulfamethoxazole-trimethoprim (BACTRIM DS) 800-160 MG tablet; Take 1 tablet by mouth 2 (two) times daily.  Dispense: 10 tablet; Refill: 1    Sabino Dick, DO [place] [facility]

## 2022-11-19 ENCOUNTER — Ambulatory Visit (INDEPENDENT_AMBULATORY_CARE_PROVIDER_SITE_OTHER): Payer: Managed Care, Other (non HMO) | Admitting: Family Medicine

## 2022-11-19 ENCOUNTER — Other Ambulatory Visit: Payer: Self-pay

## 2022-11-19 VITALS — BP 164/104 | HR 97 | Ht 63.0 in | Wt 182.8 lb

## 2022-11-19 DIAGNOSIS — F4321 Adjustment disorder with depressed mood: Secondary | ICD-10-CM

## 2022-11-19 DIAGNOSIS — E782 Mixed hyperlipidemia: Secondary | ICD-10-CM

## 2022-11-19 DIAGNOSIS — I10 Essential (primary) hypertension: Secondary | ICD-10-CM | POA: Diagnosis not present

## 2022-11-19 DIAGNOSIS — L732 Hidradenitis suppurativa: Secondary | ICD-10-CM

## 2022-11-19 DIAGNOSIS — K219 Gastro-esophageal reflux disease without esophagitis: Secondary | ICD-10-CM | POA: Diagnosis not present

## 2022-11-19 MED ORDER — VERAPAMIL HCL ER 240 MG PO TBCR: 240.0000 mg | EXTENDED_RELEASE_TABLET | Freq: Every day | ORAL | 0 refills | Status: DC

## 2022-11-19 MED ORDER — METOPROLOL SUCCINATE ER 25 MG PO TB24: 25.0000 mg | ORAL_TABLET | Freq: Every day | ORAL | 3 refills | Status: DC

## 2022-11-19 MED ORDER — OMEPRAZOLE 20 MG PO CPDR: 20.0000 mg | DELAYED_RELEASE_CAPSULE | Freq: Every day | ORAL | 2 refills | Status: DC

## 2022-11-19 MED ORDER — SULFAMETHOXAZOLE-TRIMETHOPRIM 800-160 MG PO TABS: 1.0000 | ORAL_TABLET | Freq: Two times a day (BID) | ORAL | 1 refills | Status: DC

## 2022-11-19 MED ORDER — ATORVASTATIN CALCIUM 20 MG PO TABS: 20.0000 mg | ORAL_TABLET | Freq: Every day | ORAL | 3 refills | Status: DC

## 2022-11-19 MED ORDER — SERTRALINE HCL 50 MG PO TABS: 50.0000 mg | ORAL_TABLET | Freq: Every day | ORAL | 3 refills | Status: DC

## 2022-11-29 NOTE — Progress Notes (Signed)
    SUBJECTIVE:   CHIEF COMPLAINT / HPI:   Blood Pressure F/U Anne Gonzalez is a 47 y.o. female who presents to the clinic today for follow up on blood pressure. Patient was last seen on 2/26 for follow-up on her blood pressure.  She has difficult to control blood pressure and has multiple adverse effects to several different antihypertensives. During our last visit, her verapamil was increased to 240 mg, she was also started on metoprolol succinate 25 mg (also to potentially help with migraine ppx). She also takes Losartan 100 mg.  She has had previous intolerances to lisinopril, amlodipine, and HCTZ. Reports good compliance and no adverse effects to current medications. She does not check home blood pressures. Denies chest pain, shortness of breath, lower extremity edema, headaches. She does endorse blurred vision but states her glasses prescription is outdated.   BP Readings from Last 3 Encounters:  12/03/22 (!) 175/100  11/19/22 (!) 164/104  11/13/22 (!) 164/94    PERTINENT  PMH / PSH:  Past Medical History:  Diagnosis Date   Acid reflux 2005   Environmental allergies 2019   Hypertension    Irregular heart beat 2019   Migraine 2019   Sleep apnea 2019   OBJECTIVE:   BP (!) 175/100   Pulse 71   Ht 5\' 4"  (1.626 m)   Wt 183 lb 12.8 oz (83.4 kg)   LMP 10/08/2022 (Approximate)   SpO2 99%   BMI 31.55 kg/m   Vitals:   12/03/22 0838 12/03/22 0846  BP: (!) 175/100 (!) 178/102  Pulse: 71   SpO2: 99%    General: NAD, pleasant, able to participate in exam Cardiac: RRR, no murmurs. Respiratory:  normal effort, CTAB without wheezing/rhonchi/rales Extremities: no edema b/l lower extremities  Psych: Normal affect and mood  ASSESSMENT/PLAN:   1. Essential hypertension Still elevated on two checks. Asymptomatic. Pt reports these numbers are lower than her typical which has been in the 449Q systolic. She is adherent to medications. Has not had 24 hr ambulatory monitoring  yet. Limited medication options available.  - Start: spironolactone (ALDACTONE) 50 MG tablet; Take 1 tablet (50 mg total) by mouth daily.  Dispense: 30 tablet; Refill: 0 - Continue Verapamil 240 mg, Metoprolol 25 mg, Losartan 100 mg  - Scheduled to meet with Dr. Valentina Lucks for 24-hr ambulatory monitoring on 7/59 - Basic Metabolic Panel; Future; scheduled for 2/19 after appt with Dr. Valentina Lucks  - Lifestyle interventions: salt reduction, exercise, weight loss       Sharion Settler, [place]

## 2022-12-03 ENCOUNTER — Encounter: Payer: Self-pay | Admitting: Family Medicine

## 2022-12-03 ENCOUNTER — Ambulatory Visit (INDEPENDENT_AMBULATORY_CARE_PROVIDER_SITE_OTHER): Payer: Managed Care, Other (non HMO) | Admitting: Family Medicine

## 2022-12-03 VITALS — BP 178/102 | HR 71 | Ht 64.0 in | Wt 183.8 lb

## 2022-12-03 DIAGNOSIS — I10 Essential (primary) hypertension: Secondary | ICD-10-CM | POA: Diagnosis not present

## 2022-12-03 MED ORDER — SPIRONOLACTONE 50 MG PO TABS: 50.0000 mg | ORAL_TABLET | Freq: Every day | ORAL | 0 refills | Status: DC

## 2022-12-03 NOTE — Patient Instructions (Signed)
It was wonderful to see you today.  Today we talked about:  Your blood pressure was still elevated today. Continue taking your Verapamil, Metoprolol, and Losartan. We are adding another medication called Spironolactone. Take this once a day (can be morning or night). Return next Tuesday to meet with our pharmacist, Dr. Valentina Lucks, for 24 hour blood pressure monitoring. You will also have lab work at that time to check your kidneys and electrolytes.  Please bring your medications to your next appointment!  Thank you for coming to your visit as scheduled. We have had a large "no-show" problem lately, and this significantly limits our ability to see and care for patients. As a friendly reminder- if you cannot make your appointment please call to cancel. We do have a no show policy for those who do not cancel within 24 hours. Our policy is that if you miss or fail to cancel an appointment within 24 hours, 3 times in a 42-monthperiod, you may be dismissed from our clinic.   Thank you for choosing [facility]   Please call [phone] any questions about today's appointment.  Please be sure to schedule follow up at the front  desk before you leave today.   ASharion Settler DO PGY-3 Family Medicine

## 2022-12-11 ENCOUNTER — Ambulatory Visit: Payer: Managed Care, Other (non HMO) | Admitting: Pharmacist

## 2022-12-11 ENCOUNTER — Other Ambulatory Visit: Payer: Self-pay

## 2022-12-14 ENCOUNTER — Ambulatory Visit (INDEPENDENT_AMBULATORY_CARE_PROVIDER_SITE_OTHER): Payer: Managed Care, Other (non HMO) | Admitting: Pharmacist

## 2022-12-14 VITALS — BP 150/80 | HR 54 | Wt 180.6 lb

## 2022-12-14 DIAGNOSIS — I10 Essential (primary) hypertension: Secondary | ICD-10-CM | POA: Diagnosis not present

## 2022-12-14 NOTE — Progress Notes (Signed)
S:     Chief Complaint  Patient presents with   Medication Management    Elevated Blood Pressure   47 y.o. female who presents for hypertension evaluation, education, and management.  PMH is significant for HTN.  Patient was referred and last seen by Primary Care Provider, Dr. Nita Sells, on 12/03/22.  At last visit, Spironolactone 50 mg daily was added to help control HTN.   Diagnosed with Hypertension in the year of 2019. Patient states they have been feeling well on new spironolactone medication and feels like their BP has improved since initiation of it. Patient reports occasionally measuring BP at home when she thinks about it - reports BP values around 120/80.  Medication compliance is reported to be adherent.  Discussed procedure for wearing the monitor and gave patient written instructions. Monitor was placed on non-dominant arm with instructions to return in the morning.   Current BP Medications include:  Losartan 100 mg daily, Metoprolol succinate SR 25 mg daily, Spironolactone 50 mg daily, Verapamil SR 240 mg daily   Antihypertensives tried in the past include: Amlodipine (swelling), HCTZ (swelling/headache), Lisinopril (cough)   O:  Review of Systems  All other systems reviewed and are negative.   Physical Exam Constitutional:      Appearance: Normal appearance.  Pulmonary:     Breath sounds: Normal breath sounds.  Neurological:     Mental Status: She is alert.  Psychiatric:        Mood and Affect: Mood normal.        Behavior: Behavior normal.        Thought Content: Thought content normal.        Judgment: Judgment normal.     Last 3 Office BP readings: BP Readings from Last 3 Encounters:  12/14/22 (!) 150/80  12/03/22 (!) 178/102  11/19/22 (!) 164/104    Clinical Atherosclerotic Cardiovascular Disease (ASCVD): No  The 10-year ASCVD risk score (Arnett DK, et al., 2019) is: 10.8%   Values used to calculate the score:     Age: 47 years     Sex:  Female     Is Non-Hispanic African American: Yes     Diabetic: No     Tobacco smoker: Yes     Systolic Blood Pressure: Q000111Q mmHg     Is BP treated: Yes     HDL Cholesterol: 66 mg/dL     Total Cholesterol: 258 mg/dL  Basic Metabolic Panel    Component Value Date/Time   NA 137 06/18/2022 1609   NA 142 08/03/2020 1121   K 3.5 06/18/2022 1609   CL 101 06/18/2022 1609   CO2 22 06/18/2022 1609   GLUCOSE 146 (H) 06/18/2022 1609   BUN 9 06/18/2022 1609   BUN 11 08/03/2020 1121   CREATININE 0.96 06/18/2022 1609   CALCIUM 10.0 06/18/2022 1609   GFRNONAA >60 06/18/2022 1609   GFRAA 89 08/03/2020 1121    For Office Goal Goal BP of <130/80:  ABPM thresholds: Overall BP < 125/75, daytime BP <130/80 mmHg, sleeptime BP <110/65 mmHg    A/P: History of hypertension longstanding and uncontrolled despite multiple drug therapies, recently initiated on spironolactone. Goal presssure of <130/80      -24-hour ambulatory cuff was placed on the patient's left arm, written patient instructions provided -BMET was performed today -Patient instructed to return to clinic on 3/25 at 8:30 AM to remove cuff and review results  Total time in face to face counseling 25 minutes.    Follow-up:  Pharmacist 12/17/22 Patient seen with Estelle June, PharmD Candidate.

## 2022-12-14 NOTE — Assessment & Plan Note (Signed)
History of hypertension longstanding and uncontrolled despite multiple drug therapies, recently initiated on spironolactone. Goal presssure of <130/80      -24-hour ambulatory cuff was placed on the patient's left arm, written patient instructions provided -BMET was performed today -Patient instructed to return to clinic on 3/25 at 8:30 AM to remove cuff and review results

## 2022-12-14 NOTE — Progress Notes (Signed)
Reviewed and agree with Dr Koval's plan.   

## 2022-12-14 NOTE — Patient Instructions (Signed)
Blood Pressure Activity Diary Time Lying down/ Sleeping Walking/ Exercise Stressed/ Angry Headache/ Pain Dizzy  9 AM       10 AM       11 AM       12 PM       1 PM       2 PM       Time Lying down/ Sleeping Walking/ Exercise Stressed/ Angry Headache/ Pain Dizzy  3 PM       4 PM        5 PM       6 PM       7 PM       8 PM       Time Lying down/ Sleeping Walking/ Exercise Stressed/ Angry Headache/ Pain Dizzy  9 PM       10 PM       11 PM       12 AM       1 AM       2 AM       3 AM       Time Lying down/ Sleeping Walking/ Exercise Stressed/ Angry Headache/ Pain Dizzy  4 AM       5 AM       6 AM       7 AM       8 AM       9 AM       10 AM        Time you woke up: _________                  Time you went to sleep:__________  Come back 3/25 at 8:30 to have the monitor removed Call the [facility] if you have any questions before then [phone])  Wearing the Blood Pressure Monitor The cuff will inflate every 20 minutes during the day and every 30 minutes while you sleep. Your blood pressure readings will NOT display after cuff inflation Fill out the blood pressure-activity diary during the day, especially during activities that may affect your reading -- such as exercise, stress, walking, taking your blood pressure medications  Important things to know: Avoid taking the monitor off for the next 24 hours, unless it causes you discomfort or pain. Do NOT get the monitor wet and do NOT dry to clean the monitor with any cleaning products. Do NOT put the monitor on anyone else's arm. When the cuff inflates, avoid excess movement. Let the cuffed arm hang loosely, slightly away from the body. Avoid flexing the muscles or moving the hand/fingers. When you go to sleep, make sure that the hose is not kinked. Remember to fill out the blood pressure activity diary. If you experience severe pain or unusual pain (not associated with getting your blood pressure checked),  remove the monitor.  Troubleshooting:  Code  Troubleshooting   1  Check cuff position, tighten cuff   2, 3  Remain still during reading   4, 87  Check air hose connections and make sure cuff is tight   85, 89  Check hose connections and make tubing is not crimped   86  Push START/STOP to restart reading   88, 91  Retry by pushing START/STOP   90  Replace batteries. If problem persists, remove monitor and bring back to   clinic at follow up   97, 98, 99  Service required - Remove monitor and bring back to clinic at  follow up    

## 2022-12-15 LAB — BASIC METABOLIC PANEL
BUN/Creatinine Ratio: 18 (ref 9–23)
BUN: 15 mg/dL (ref 6–24)
CO2: 22 mmol/L (ref 20–29)
Calcium: 9.3 mg/dL (ref 8.7–10.2)
Chloride: 106 mmol/L (ref 96–106)
Creatinine, Ser: 0.85 mg/dL (ref 0.57–1.00)
Glucose: 130 mg/dL — ABNORMAL HIGH (ref 70–99)
Potassium: 4.3 mmol/L (ref 3.5–5.2)
Sodium: 141 mmol/L (ref 134–144)
eGFR: 84 mL/min/{1.73_m2} (ref >59)

## 2022-12-17 ENCOUNTER — Ambulatory Visit (INDEPENDENT_AMBULATORY_CARE_PROVIDER_SITE_OTHER): Payer: Managed Care, Other (non HMO) | Admitting: Pharmacist

## 2022-12-17 DIAGNOSIS — I10 Essential (primary) hypertension: Secondary | ICD-10-CM | POA: Diagnosis not present

## 2022-12-17 MED ORDER — CARVEDILOL 6.25 MG PO TABS: 6.2500 mg | ORAL_TABLET | Freq: Two times a day (BID) | ORAL | 1 refills | Status: DC

## 2022-12-17 MED ORDER — SPIRONOLACTONE 50 MG PO TABS: 50.0000 mg | ORAL_TABLET | Freq: Every day | ORAL | 0 refills | Status: DC

## 2022-12-17 MED ORDER — VERAPAMIL HCL ER 240 MG PO TBCR: 240.0000 mg | EXTENDED_RELEASE_TABLET | Freq: Every day | ORAL | 0 refills | Status: DC

## 2022-12-17 MED ORDER — LOSARTAN POTASSIUM 100 MG PO TABS: 100.0000 mg | ORAL_TABLET | Freq: Every day | ORAL | 2 refills | Status: DC

## 2022-12-17 NOTE — Progress Notes (Signed)
Reviewed and agree with Dr Koval's plan.   

## 2022-12-17 NOTE — Assessment & Plan Note (Signed)
History of resistant hypertension for 5 years with goal presssure of <130/80. Patient was able to wear cuff for 24 hours. Found to have persistently elevated and uncontrolled blood pressure. Patient completed 24-hour ambulatory blood pressure evaluation which demonstrates an average AWAKE blood pressure of 149/87 mmHg. Nocturnal dipping pattern is normal.   Changes to medications -Discontinued Metoprolol 25 mg daily -Started Carvedilol 6.25 mg BID with meals Discussed potential for trail of HCTZ at next visit

## 2022-12-17 NOTE — Progress Notes (Signed)
S:     Chief Complaint  Patient presents with   Medication Management    HTN    47 y.o. female who presents for hypertension evaluation, education, and management.  PMH is significant for HTN, HLD.  Patient was referred and last seen by Primary Care Provider, Dr. Nita Sells, on 12/03/22.  At last visit, spironolactone 50 mg was initiated to help control HTN   Diagnosed with Hypertension in the year of 2019.    Medication compliance is reported to be adherent.  Current BP Medications include:  Losartan 100 mg daily, Metoprolol  XR 25 mg daily, Spironolactone 50 mg daily, Verapamil 240 mg daily  Antihypertensives tried in the past include: Lisinopril, HCTZ, amlodipine  Day #2 - Patient returns to clinic and reports they were able to wear the Ambulatory Blood Pressure Cuff for the entire 24 evaluation period. Patient reports being stressed/angry from 2-4 PM while wearing the cuff.  O:  Review of Systems  All other systems reviewed and are negative.   Physical Exam Constitutional:      Appearance: Normal appearance.  Neurological:     Mental Status: She is alert.  Psychiatric:        Mood and Affect: Mood normal.        Behavior: Behavior normal.        Thought Content: Thought content normal.        Judgment: Judgment normal.     Last 3 Office BP readings: BP Readings from Last 3 Encounters:  12/14/22 (!) 150/80  12/03/22 (!) 178/102  11/19/22 (!) 164/104    Clinical Atherosclerotic Cardiovascular Disease (ASCVD): No  The 10-year ASCVD risk score (Arnett DK, et al., 2019) is: 10.8%   Values used to calculate the score:     Age: 62 years     Sex: Female     Is Non-Hispanic African American: Yes     Diabetic: No     Tobacco smoker: Yes     Systolic Blood Pressure: Q000111Q mmHg     Is BP treated: Yes     HDL Cholesterol: 66 mg/dL     Total Cholesterol: 258 mg/dL  Basic Metabolic Panel    Component Value Date/Time   NA 141 12/14/2022 1220   K 4.3 12/14/2022 1220    CL 106 12/14/2022 1220   CO2 22 12/14/2022 1220   GLUCOSE 130 (H) 12/14/2022 1220   GLUCOSE 146 (H) 06/18/2022 1609   BUN 15 12/14/2022 1220   CREATININE 0.85 12/14/2022 1220   CALCIUM 9.3 12/14/2022 1220   GFRNONAA >60 06/18/2022 1609   GFRAA 89 08/03/2020 1121     ABPM Study Data: Arm Placement left arm  Overall Mean 24hr BP:   142/83 mmHg  HR: 79  Daytime Mean BP:  149/87 mmHg  HR: 80  Nighttime Mean BP:  124/72 mmHg  HR: 75  Dipping Pattern: Yes.    Sys:   16.8%   Dia: 17.9%   [normal dipping ~10-20%]   For Office Goal Goal BP of <130/80:  ABPM thresholds: Overall BP < 125/75, daytime BP <130/80 mmHg, sleeptime BP <110/65 mmHg    A/P: History of resistant hypertension for 5 years with goal presssure of <130/80. Patient was able to wear cuff for 24 hours. Found to have persistently elevated and uncontrolled blood pressure. Patient completed 24-hour ambulatory blood pressure evaluation which demonstrates an average AWAKE blood pressure of 149/87 mmHg. Nocturnal dipping pattern is normal.   Changes to medications -Discontinued Metoprolol 25 mg  daily -Started Carvedilol 6.25 mg BID with meals Discussed potential for trail of HCTZ at next visit (listed as swelling intolerance, however, this was more likely amlodipine).   Results reviewed and written information provided.    Written patient instructions provided. Patient verbalized understanding of treatment plan.  Total time in face to face counseling 30 minutes.    Follow-up:  Pharmacist 01/09/23  Patient seen with Estelle June, PharmD Candidate.

## 2022-12-17 NOTE — Patient Instructions (Addendum)
It was nice seeing you today!  STOP Metoprolol 25 mg daily START Carvedilol 6.25 mg BID  We will see you soon!

## 2023-01-09 ENCOUNTER — Ambulatory Visit (INDEPENDENT_AMBULATORY_CARE_PROVIDER_SITE_OTHER): Payer: Managed Care, Other (non HMO) | Admitting: Pharmacist

## 2023-01-09 VITALS — BP 127/79 | HR 64 | Wt 182.8 lb

## 2023-01-09 DIAGNOSIS — L732 Hidradenitis suppurativa: Secondary | ICD-10-CM | POA: Diagnosis not present

## 2023-01-09 DIAGNOSIS — I10 Essential (primary) hypertension: Secondary | ICD-10-CM | POA: Diagnosis not present

## 2023-01-09 MED ORDER — SULFAMETHOXAZOLE-TRIMETHOPRIM 800-160 MG PO TABS: 1.0000 | ORAL_TABLET | Freq: Two times a day (BID) | ORAL | 0 refills | Status: DC

## 2023-01-09 NOTE — Assessment & Plan Note (Signed)
Hypertension diagnosed currently on multiple medications. BP goal < 130/80 mmHg. Medication adherence appears good. Control is optimal due to in-office BP readings under goal.  -Continued Carvedilol 6.25 mg BID.  -Continued Verapamil 240 mg daily -Continued Losartan 100 mg daily.  -Continued Spironolactone 50 mg daily -Counseled on lifestyle modifications for blood pressure control including reduced dietary sodium, increased exercise, adequate sleep. -Encouraged patient to check BP at home and bring log of readings to next visit. Counseled on proper use of home BP cuff.

## 2023-01-09 NOTE — Progress Notes (Signed)
S:     Chief Complaint  Patient presents with   Medication Management    HTN F/U   47 y.o. female who presents for hypertension evaluation, education, and management.  PMH is significant for HTN.  Patient was referred and last seen by Primary Care Provider, Dr. Melba Coon, on 11/19/22.   At last visit, metoprolol was discontinued and carvedilol 6.25 mg BID was initiated.   Today, patient arrives in good spirits and presents without any assistance. Denies dizziness, headache, blurred vision, swelling.   Patient reports hypertension was diagnosed in 2019.   Medication adherence good . Patient has taken BP medications today.   Current antihypertensives include: Carvedilol 6.25 mg BID, Verapamil-CR 240 mg daily, losartan 100 mg daily, sprionolactone 50 mg daily  Antihypertensives tried in the past include: Amlodipine (swelling), Lisinopril, HCTZ  Reported home BP readings: Has measured at work, tends to be 130s-140s systolic.  Patient reported dietary habits: Took pork out of diet which had originally caused her migraines. Migraines have minimal/ nearly resolved since eliminating pork from diet.  O:  Review of Systems  All other systems reviewed and are negative.   Physical Exam Constitutional:      Appearance: Normal appearance.  Pulmonary:     Breath sounds: Normal breath sounds.  Neurological:     Mental Status: She is alert.  Psychiatric:        Mood and Affect: Mood normal.        Behavior: Behavior normal.        Thought Content: Thought content normal.        Judgment: Judgment normal.     Last 3 Office BP readings: BP Readings from Last 3 Encounters:  01/09/23 127/79  12/14/22 (!) 150/80  12/03/22 (!) 178/102    BMET    Component Value Date/Time   NA 141 12/14/2022 1220   K 4.3 12/14/2022 1220   CL 106 12/14/2022 1220   CO2 22 12/14/2022 1220   GLUCOSE 130 (H) 12/14/2022 1220   GLUCOSE 146 (H) 06/18/2022 1609   BUN 15 12/14/2022 1220   CREATININE  0.85 12/14/2022 1220   CALCIUM 9.3 12/14/2022 1220   GFRNONAA >60 06/18/2022 1609   GFRAA 89 08/03/2020 1121    Clinical ASCVD: No  The 10-year ASCVD risk score (Arnett DK, et al., 2019) is: 5.3%   Values used to calculate the score:     Age: 66 years     Sex: Female     Is Non-Hispanic African American: Yes     Diabetic: No     Tobacco smoker: Yes     Systolic Blood Pressure: 127 mmHg     Is BP treated: Yes     HDL Cholesterol: 66 mg/dL     Total Cholesterol: 258 mg/dL   A/P: Hypertension diagnosed currently on multiple medications. BP goal < 130/80 mmHg. Medication adherence appears good. Control is optimal due to in-office BP readings under goal.  -Continued Carvedilol 6.25 mg BID.  -Continued Verapamil 240 mg daily -Continued Losartan 100 mg daily.  -Continued Spironolactone 50 mg daily -Counseled on lifestyle modifications for blood pressure control including reduced dietary sodium, increased exercise, adequate sleep. -Encouraged patient to check BP at home and bring log of readings to next visit. Counseled on proper use of home BP cuff.   Results reviewed and written information provided.    Patient requested refill of antibiotic for Hydradenitis Suppurativa.  Discussed with Dr. Perley Jain, attending for the clinic session, who approved the refill  x1.  Patient reports plans to visit dermatologist in near future for this chronic condition.     Written patient instructions provided. Patient verbalized understanding of treatment plan.  Total time in face to face counseling 15 minutes.    Follow-up:  Patient seen with Jerry Caras, PharmD PGY-1 Pharmacy Resident and Revonda Standard, PharmD Candidate.

## 2023-01-09 NOTE — Patient Instructions (Signed)
It was nice to see you today!  Your goal blood pressure is <130/80 mmHg.  Medication Changes: We have no changes for you today! Keep up the good work!   Monitor blood pressure at home daily and keep a log (on your phone or piece of paper) to bring with you to your next visit. Write down date, time, blood pressure and pulse.  Keep up the good work with diet and exercise. Aim for a diet full of vegetables, fruit and lean meats (chicken, [place], fish). Try to limit salt intake by eating fresh or frozen vegetables (instead of canned), rinse canned vegetables prior to cooking and do not add any additional salt to meals.

## 2023-01-10 NOTE — Progress Notes (Signed)
Reviewed and agree with Dr Koval's plan.   

## 2023-01-21 ENCOUNTER — Other Ambulatory Visit: Payer: Self-pay | Admitting: Family Medicine

## 2023-01-21 DIAGNOSIS — I10 Essential (primary) hypertension: Secondary | ICD-10-CM

## 2023-05-01 ENCOUNTER — Emergency Department (HOSPITAL_COMMUNITY)
Admission: EM | Admit: 2023-05-01 | Discharge: 2023-05-01 | Disposition: A | Discharge status: Home / Self Care | Payer: Managed Care, Other (non HMO) | Attending: Emergency Medicine | Admitting: Emergency Medicine

## 2023-05-01 ENCOUNTER — Emergency Department (HOSPITAL_COMMUNITY): Payer: Managed Care, Other (non HMO)

## 2023-05-01 DIAGNOSIS — R110 Nausea: Secondary | ICD-10-CM

## 2023-05-01 DIAGNOSIS — R202 Paresthesia of skin: Secondary | ICD-10-CM | POA: Diagnosis not present

## 2023-05-01 DIAGNOSIS — R55 Syncope and collapse: Secondary | ICD-10-CM | POA: Diagnosis not present

## 2023-05-01 DIAGNOSIS — R030 Elevated blood-pressure reading, without diagnosis of hypertension: Secondary | ICD-10-CM | POA: Diagnosis not present

## 2023-05-01 DIAGNOSIS — R739 Hyperglycemia, unspecified: Secondary | ICD-10-CM | POA: Diagnosis not present

## 2023-05-01 DIAGNOSIS — I10 Essential (primary) hypertension: Secondary | ICD-10-CM | POA: Diagnosis not present

## 2023-05-01 LAB — DIFFERENTIAL
Abs Immature Granulocytes: 0.05 10*3/uL (ref 0.00–0.07)
Basophils Absolute: 0 10*3/uL (ref 0.0–0.1)
Basophils Relative: 0 %
Eosinophils Absolute: 0.1 10*3/uL (ref 0.0–0.5)
Eosinophils Relative: 1 %
Immature Granulocytes: 1 %
Lymphocytes Relative: 25 %
Lymphs Abs: 2.3 10*3/uL (ref 0.7–4.0)
Monocytes Absolute: 0.5 10*3/uL (ref 0.1–1.0)
Monocytes Relative: 6 %
Neutro Abs: 6.1 10*3/uL (ref 1.7–7.7)
Neutrophils Relative %: 67 %

## 2023-05-01 LAB — I-STAT CHEM 8, ED
BUN: 16 mg/dL (ref 6–20)
Calcium, Ion: 1.09 mmol/L — ABNORMAL LOW (ref 1.15–1.40)
Chloride: 105 mmol/L (ref 98–111)
Creatinine, Ser: 1 mg/dL (ref 0.44–1.00)
Glucose, Bld: 224 mg/dL — ABNORMAL HIGH (ref 70–99)
HCT: 38 % (ref 36.0–46.0)
Hemoglobin: 12.9 g/dL (ref 12.0–15.0)
Potassium: 4.6 mmol/L (ref 3.5–5.1)
Sodium: 138 mmol/L (ref 135–145)
TCO2: 24 mmol/L (ref 22–32)

## 2023-05-01 LAB — COMPREHENSIVE METABOLIC PANEL
ALT: 24 U/L (ref 0–44)
AST: 29 U/L (ref 15–41)
Albumin: 3.4 g/dL — ABNORMAL LOW (ref 3.5–5.0)
Alkaline Phosphatase: 64 U/L (ref 38–126)
Anion gap: 14 (ref 5–15)
BUN: 14 mg/dL (ref 6–20)
CO2: 21 mmol/L — ABNORMAL LOW (ref 22–32)
Calcium: 9.2 mg/dL (ref 8.9–10.3)
Chloride: 101 mmol/L (ref 98–111)
Creatinine, Ser: 1.08 mg/dL — ABNORMAL HIGH (ref 0.44–1.00)
GFR, Estimated: >60 mL/min (ref >60)
Glucose, Bld: 218 mg/dL — ABNORMAL HIGH (ref 70–99)
Potassium: 4.3 mmol/L (ref 3.5–5.1)
Sodium: 136 mmol/L (ref 135–145)
Total Bilirubin: 0.6 mg/dL (ref 0.3–1.2)
Total Protein: 7.1 g/dL (ref 6.5–8.1)

## 2023-05-01 LAB — CBC
HCT: 37.7 % (ref 36.0–46.0)
Hemoglobin: 12.4 g/dL (ref 12.0–15.0)
MCH: 31.7 pg (ref 26.0–34.0)
MCHC: 32.9 g/dL (ref 30.0–36.0)
MCV: 96.4 fL (ref 80.0–100.0)
Platelets: 373 10*3/uL (ref 150–400)
RBC: 3.91 MIL/uL (ref 3.87–5.11)
RDW: 12.6 % (ref 11.5–15.5)
WBC: 9 10*3/uL (ref 4.0–10.5)
nRBC: 0 % (ref 0.0–0.2)

## 2023-05-01 LAB — APTT: aPTT: 24 seconds (ref 24–36)

## 2023-05-01 LAB — PROTIME-INR
INR: 0.9 (ref 0.8–1.2)
Prothrombin Time: 12.5 seconds (ref 11.4–15.2)

## 2023-05-01 LAB — CBG MONITORING, ED: Glucose-Capillary: 217 mg/dL — ABNORMAL HIGH (ref 70–99)

## 2023-05-01 LAB — ETHANOL: Alcohol, Ethyl (B): <10 mg/dL (ref <10)

## 2023-05-01 LAB — HCG, SERUM, QUALITATIVE: Preg, Serum: NEGATIVE

## 2023-05-01 LAB — TROPONIN I (HIGH SENSITIVITY): Troponin I (High Sensitivity): 7 ng/L (ref <18)

## 2023-05-01 MED ORDER — TRAMADOL HCL 50 MG PO TABS: 50.0000 mg | ORAL_TABLET | Freq: Once | ORAL | Status: DC

## 2023-05-01 MED ORDER — VERAPAMIL HCL ER 240 MG PO TBCR
240.0000 mg | EXTENDED_RELEASE_TABLET | Freq: Once | ORAL | Status: DC
  Filled 2023-05-01: qty 1

## 2023-05-01 MED ORDER — ACETAMINOPHEN 500 MG PO TABS
1000.0000 mg | ORAL_TABLET | Freq: Once | ORAL | Status: AC
  Administered 2023-05-01: 1000 mg via ORAL
  Filled 2023-05-01: qty 2

## 2023-05-01 MED ORDER — HYDRALAZINE HCL 20 MG/ML IJ SOLN
10.0000 mg | Freq: Once | INTRAMUSCULAR | Status: AC
  Administered 2023-05-01: 10 mg via INTRAVENOUS
  Filled 2023-05-01: qty 1

## 2023-05-01 MED ORDER — SODIUM CHLORIDE 0.9% FLUSH
3.0000 mL | Freq: Once | INTRAVENOUS | Status: AC
  Administered 2023-05-01: 3 mL via INTRAVENOUS

## 2023-05-01 MED ORDER — METOPROLOL SUCCINATE ER 25 MG PO TB24: 25.0000 mg | ORAL_TABLET | Freq: Once | ORAL | Status: DC

## 2023-05-01 MED ORDER — LABETALOL HCL 5 MG/ML IV SOLN
20.0000 mg | Freq: Once | INTRAVENOUS | Status: AC
  Administered 2023-05-01: 20 mg via INTRAVENOUS
  Filled 2023-05-01: qty 4

## 2023-05-01 NOTE — ED Notes (Signed)
Patient transported to MRI 

## 2023-05-01 NOTE — ED Notes (Signed)
Patient wants to go home and take her home medication for blood pressure and headache. EDP notified and was okay with that.

## 2023-05-01 NOTE — Consult Note (Addendum)
Neurology Consultation  Reason for Consult: Code Stroke Referring Physician: EDP  CC:  History is obtained from: patient  HPI: Anne Gonzalez is a 47 y.o. female with a past medical history of hypertension presenting with left sided arm weakness and a syncopal episode while she was at work. The patient works as an Charity fundraiser at [employer]. She notes that she started having left sided numbness in her arm and was feeling weak. She had a syncopal episode where her coworkers were able to sit her down on the floor. During the episode the patient felt short of breath and she was quite anxious upon arrival to the ED. Systolic BP 240 on arrival.   She is currently endorsing a headache and has some decreased sensation on left side.   LKW: 9:05 TNK given?: No  Mechanical Thrombectomy? No Premorbid modified Rankin scale (mRS): 0 0-Completely asymptomatic and back to baseline post-stroke   ROS: Full ROS was performed and is negative except as noted in the HPI.    Past Medical History:  Diagnosis Date   Acid reflux 2005   Environmental allergies 2019   Hypertension    Irregular heart beat 2019   Migraine 2019   Sleep apnea 2019      Family History  Problem Relation Age of Onset   Diabetes Father    Hypertension Father    Heart failure Father    Alcohol abuse Father    Drug abuse Father    Depression Father    Hyperlipidemia Father    Sickle cell anemia Sister      Social History:   reports that she has been smoking cigarettes. She started smoking about 34 years ago. She has never used smokeless tobacco. She reports current alcohol use. She reports that she does not use drugs.  Smokes 0.5 packs/day - has tried various methods to quit  Medications  Current Facility-Administered Medications:    sodium chloride flush (NS) 0.9 % injection 3 mL, 3 mL, Intravenous, Once, Cathren Laine, MD  Current Outpatient Medications:    atorvastatin (LIPITOR) 20 MG tablet, Take 1 tablet (20  mg total) by mouth daily., Disp: 30 tablet, Rfl: 3   Capsaicin (CAPSAICIN HP) 0.1 % CREA, Apply to painful left back area as needed for pain. Wash hands afterwards., Disp: 42.5 g, Rfl: 0   carvedilol (COREG) 6.25 MG tablet, Take 1 tablet (6.25 mg total) by mouth 2 (two) times daily with a meal., Disp: 180 tablet, Rfl: 1   ibuprofen (ADVIL) 600 MG tablet, Take 1 tablet (600 mg total) by mouth every 8 (eight) hours as needed., Disp: 60 tablet, Rfl: 0   Lidocaine 4 % GEL, Apply to painful area every 6 hours as needed., Disp: 113 g, Rfl: 0   losartan (COZAAR) 100 MG tablet, Take 1 tablet (100 mg total) by mouth daily., Disp: 30 tablet, Rfl: 2   methocarbamol (ROBAXIN) 500 MG tablet, Take 1 tablet (500 mg total) by mouth 2 (two) times daily., Disp: 20 tablet, Rfl: 0   metoprolol succinate (TOPROL-XL) 25 MG 24 hr tablet, Take 1 tablet (25 mg total) by mouth at bedtime., Disp: 90 tablet, Rfl: 3   omeprazole (PRILOSEC) 20 MG capsule, Take 1 capsule (20 mg total) by mouth daily., Disp: 30 capsule, Rfl: 2   sertraline (ZOLOFT) 50 MG tablet, Take 1 tablet (50 mg total) by mouth daily., Disp: 30 tablet, Rfl: 3   spironolactone (ALDACTONE) 50 MG tablet, Take 1 tablet (50 mg total) by mouth daily., Disp:  30 tablet, Rfl: 0   sulfamethoxazole-trimethoprim (BACTRIM DS) 800-160 MG tablet, Take 1 tablet by mouth 2 (two) times daily., Disp: 10 tablet, Rfl: 0   SUMAtriptan (IMITREX) 100 MG tablet, Take 1 tablet (100 mg total) by mouth every 2 (two) hours as needed for migraine. May repeat in 2 hours if headache persists or recurs., Disp: 30 tablet, Rfl: 1   verapamil (CALAN-SR) 240 MG CR tablet, TAKE 1 TABLET BY MOUTH AT BEDTIME, Disp: 60 tablet, Rfl: 0  (Not in a hospital admission)    Exam: Current vital signs: Wt 89.2 kg   BMI 33.76 kg/m  Vital signs in last 24 hours: Weight:  [89.2 kg] 89.2 kg (08/07 1000)  GENERAL: Awake, alert in NAD, but is anxious appearing HEENT: - Normocephalic and atraumatic, dry  mm, no LN++, no Thyromegally LUNGS - Clear to auscultation bilaterally with no wheezes CV - S1S2 RRR, no m/r/g, equal pulses bilaterally.  NEURO:  Mental Status: AA&Ox3  Language: speech is soft, but clear.  Naming, repetition, fluency, and comprehension intact.  Cranial Nerves: PERRL. EOMI, visual fields full, no facial asymmetry, facial sensation intact, hearing intact, tongue/uvula/soft palate midline, normal sternocleidomastoid and trapezius muscle strength. No evidence of tongue atrophy or fasciculations Motor: 5/5 strength bilateral upper and lower extremities - does require encouragement.  Tone: is normal and bulk is normal Sensation- Intact to light touch, slightly decreased on left compared to right but is still aware she is being touched.  Coordination: FTN intact bilaterally, no ataxia in BLE. Gait- normal gait   NIHSS 1a Level of Conscious.: 0 1b LOC Questions: 0 1c LOC Commands: 0 2 Best Gaze: 0 3 Visual: 0 4 Facial Palsy:0  5a Motor Arm - left: 0 5b Motor Arm - Right: 0 6a Motor Leg - Left: 0 6b Motor Leg - Right: 0 7 Limb Ataxia: 0 8 Sensory: 1 9 Best Language:0  10 Dysarthria: 0 11 Extinct. and Inatten.: 0 TOTAL: 1   Labs I have reviewed labs in epic and the results pertinent to this consultation are:  CBC unremarkable Glucose 224  CBC    Component Value Date/Time   WBC 9.0 05/01/2023 1034   RBC 3.91 05/01/2023 1034   HGB 12.9 05/01/2023 1036   HGB 11.5 08/03/2020 1121   HCT 38.0 05/01/2023 1036   HCT 35.3 08/03/2020 1121   PLT 373 05/01/2023 1034   PLT 382 08/03/2020 1121   MCV 96.4 05/01/2023 1034   MCV 95 08/03/2020 1121   MCH 31.7 05/01/2023 1034   MCHC 32.9 05/01/2023 1034   RDW 12.6 05/01/2023 1034   RDW 11.5 (L) 08/03/2020 1121   LYMPHSABS 2.3 05/01/2023 1034   LYMPHSABS 2.9 04/11/2018 1641   MONOABS 0.5 05/01/2023 1034   EOSABS 0.1 05/01/2023 1034   EOSABS 0.2 04/11/2018 1641   BASOSABS 0.0 05/01/2023 1034   BASOSABS 0.0 04/11/2018  1641    CMP     Component Value Date/Time   NA 138 05/01/2023 1036   NA 141 12/14/2022 1220   K 4.6 05/01/2023 1036   CL 105 05/01/2023 1036   CO2 22 12/14/2022 1220   GLUCOSE 224 (H) 05/01/2023 1036   BUN 16 05/01/2023 1036   BUN 15 12/14/2022 1220   CREATININE 1.00 05/01/2023 1036   CALCIUM 9.3 12/14/2022 1220   PROT 7.7 01/05/2022 1405   PROT 6.8 08/03/2020 1121   ALBUMIN 4.2 01/05/2022 1405   ALBUMIN 4.2 08/03/2020 1121   AST 12 (L) 01/05/2022 1405   ALT  10 01/05/2022 1405   ALKPHOS 56 01/05/2022 1405   BILITOT 0.5 01/05/2022 1405   BILITOT 0.3 08/03/2020 1121   GFRNONAA >60 06/18/2022 1609   GFRAA 89 08/03/2020 1121    Lipid Panel     Component Value Date/Time   CHOL 258 (H) 01/19/2022 1351   TRIG 96 01/19/2022 1351   HDL 66 01/19/2022 1351   CHOLHDL 3.9 01/19/2022 1351   LDLCALC 175 (H) 01/19/2022 1351     Imagin I have reviewed the images obtained:  CT-head negative upon personal review, formal read pending    Assessment:  47 y.o. female presenting with left sided numbness and a syncopal episode at work. Code stroke called and by the time patient arrived, symptoms had improved. She has no focal deficit on exam and CT head unremarkable. With possible syncopal episode, a cardiac etiology is on the differential, especially in the setting of her severe hypertension. Unlikely that this is an acute cerebral infarction.   Recommendations: - HgbA1c, fasting lipid panel - The following imaging if indicated  - MRI of the brain without contrast  -If MRI shows infarct, get CTA head/neck - Echocardiogram - NIHSS q2h  - Risk factor modification - Telemetry monitoring - PT consult, OT consult, Speech consult - Stroke team to follow   Elza Rafter, DO Internal Medicine Resident, PGY-3   NEUROHOSPITALIST ADDENDUM Performed a face to face diagnostic evaluation.   I have reviewed the contents of history and physical exam as documented by PA/ARNP/Resident and  agree with above documentation.  I have discussed and formulated the above plan as documented. Edits to the note have been made as needed.  Impression/Key exam findings/Plan: syncopal episode at work followed by some L arm tingling and weak all over. Also endorses some shortness of breath. Seems like she had a constellation of neuro and non neuro symptoms. She was significantly hypertensive to 240s systolic on arrival. Also noted on exam, that her weakness improved with encouragement.  Prudent to get MRI given her risk factors but overall suspicion for this being a stroke given combination of her symptoms is low. If the MRI Brain is negative, we dont need to get full stroke workup.  Erick Blinks, MD [facility] [phone]   If 7pm to 7am, please call on call as listed on AMION.

## 2023-05-01 NOTE — Code Documentation (Signed)
Anne Gonzalez is a 47 yr old female presenting to [facility] on 05/01/2023 with a PMH of migraines and HTN. Pt is coming from work where she was last known well this morning at 0905. After that, she began to feel dizzy and had to be assisted to the ground by a co-worker. Her left arm felt numb, and she was hypertensive. EMS called, and code stroke was activated. Pt is not on any thinners.    Pt met at bridge by Stroke team. CBG, labs obtained, airway cleared by EDP. Pt c/o headache, is hypertensive, and has tingling in left arm. Strength is effort-dependant but equal.To CT with team. NIHSS 1. The following imaging was obtained: CT. Per Dr. Derry Lory, CT negative for hemorrhage. Pt ambulated in CT room without difficulty.     Pt returned to room 31 where her workup will continue. She will need q 30 min NIHSS and VS until outside of treatment window for thrombolytics at 1330. Pt currently not eligible for TNK as symptoms mild, non-disabling. Pt is not candidate for thrombectomy as no LVO symptoms. Bedside handoff with RN complete.

## 2023-05-01 NOTE — ED Triage Notes (Signed)
Pt BIB [facility] from work due to feeling "weak" and having syncopal episode where co-workers slid her down to floor.  LKW [id number].  Hx HTN.  VS 170/80, CBG 230, HR 90, SpO 100% RA.   20g left arm.

## 2023-05-01 NOTE — Discharge Instructions (Addendum)
It was our pleasure to provide your ER care today - we hope that you feel better.  Your blood pressure and blood sugar are high today - make sure to drink plenty of water/fluids and stay well hydrated, follow diabetes meal plan, take meds as prescribed by your doctor, and follow up closely with your doctor in the next 1-2 weeks. Also follow up closely with neurology in 1-2 weeks - call office to arrange appointment.   Return to ER if worse, new symptoms, fevers, change in speech or vision, one-sided numbness/weakness, fainting, chest pain, trouble breathing, or other concern.

## 2023-05-01 NOTE — ED Provider Notes (Addendum)
[place] EMERGENCY DEPARTMENT AT [facility] Provider Note   CSN: [id number] Arrival date & time: 05/01/23  1028  An emergency department physician performed an initial assessment on this suspected stroke patient at 1033.  History  No chief complaint on file.   Anne Gonzalez is a 47 y.o. female.  Pt arrives via EMS as code stroke activation. Pt indicates was at work, felt mildly nauseated, then felt lightheaded/faint, and then noted numbness/tingling to left arm/hand. No syncope. Denies headache. No neck/radicular pain. Denies chest pain or discomfort. No palpitations. No sob or unusual doe. No abd pain or nvd. No dysuria. Indicates on period, no abn heavy bleeding. No rectal bleeding or melena. Denies change in meds or new meds. Hx htn, and did take blood pressure med this AM. Had eaten earlier. Denies fever or chills. No other recent faintness. No syncope. EMS indicates CBG 230 and initial bp high 170/80.   The history is provided by the patient, medical records and the EMS personnel.       Home Medications Prior to Admission medications   Medication Sig Start Date End Date Taking? Authorizing Provider  atorvastatin (LIPITOR) 20 MG tablet Take 1 tablet (20 mg total) by mouth daily. 11/19/22   Sabino Dick, DO  Capsaicin (CAPSAICIN HP) 0.1 % CREA Apply to painful left back area as needed for pain. Wash hands afterwards. 04/10/21   Valetta Close, MD  carvedilol (COREG) 6.25 MG tablet Take 1 tablet (6.25 mg total) by mouth 2 (two) times daily with a meal. 12/17/22   McDiarmid, Leighton Roach, MD  ibuprofen (ADVIL) 600 MG tablet Take 1 tablet (600 mg total) by mouth every 8 (eight) hours as needed. 04/10/21   Valetta Close, MD  Lidocaine 4 % GEL Apply to painful area every 6 hours as needed. 04/19/21   Shirlean Mylar, MD  losartan (COZAAR) 100 MG tablet Take 1 tablet (100 mg total) by mouth daily. 12/17/22   McDiarmid, Leighton Roach, MD  methocarbamol (ROBAXIN) 500 MG  tablet Take 1 tablet (500 mg total) by mouth 2 (two) times daily. 04/04/21   Dartha Lodge, PA-C  metoprolol succinate (TOPROL-XL) 25 MG 24 hr tablet Take 1 tablet (25 mg total) by mouth at bedtime. 11/19/22   Sabino Dick, DO  omeprazole (PRILOSEC) 20 MG capsule Take 1 capsule (20 mg total) by mouth daily. 11/19/22   Sabino Dick, DO  sertraline (ZOLOFT) 50 MG tablet Take 1 tablet (50 mg total) by mouth daily. 11/19/22   Sabino Dick, DO  spironolactone (ALDACTONE) 50 MG tablet Take 1 tablet (50 mg total) by mouth daily. 12/17/22   McDiarmid, Leighton Roach, MD  sulfamethoxazole-trimethoprim (BACTRIM DS) 800-160 MG tablet Take 1 tablet by mouth 2 (two) times daily. 01/09/23   McDiarmid, Leighton Roach, MD  SUMAtriptan (IMITREX) 100 MG tablet Take 1 tablet (100 mg total) by mouth every 2 (two) hours as needed for migraine. May repeat in 2 hours if headache persists or recurs. 05/20/20   Shirlean Mylar, MD  verapamil (CALAN-SR) 240 MG CR tablet TAKE 1 TABLET BY MOUTH AT BEDTIME 01/21/23   Celine Mans, MD      Allergies    Aspirin, Pineapple, Amlodipine, Hctz [hydrochlorothiazide], Lisinopril, and Doxycycline    Review of Systems   Review of Systems  Constitutional:  Negative for chills and fever.  HENT:  Negative for sore throat and trouble swallowing.   Eyes:  Negative for pain, redness and visual disturbance.  Respiratory:  Negative for  cough and shortness of breath.   Cardiovascular:  Negative for chest pain, palpitations and leg swelling.  Gastrointestinal:  Positive for nausea. Negative for abdominal pain, blood in stool, diarrhea and vomiting.  Genitourinary:  Negative for dysuria and flank pain.  Musculoskeletal:  Negative for back pain and neck pain.  Skin:  Negative for rash.  Neurological:  Positive for light-headedness and numbness. Negative for speech difficulty, weakness and headaches.  Hematological:  Does not bruise/bleed easily.  Psychiatric/Behavioral:  Negative for  confusion.     Physical Exam Updated Vital Signs BP (!) 175/108 (BP Location: Right Arm)   Pulse 62   Temp 98 F (36.7 C) (Oral)   Resp 16   Wt 89.2 kg   SpO2 96%   BMI 33.76 kg/m  Physical Exam Vitals and nursing note reviewed.  Constitutional:      Appearance: Normal appearance. She is well-developed.  HENT:     Head: Atraumatic.     Nose: Nose normal.     Mouth/Throat:     Mouth: Mucous membranes are moist.  Eyes:     General: No scleral icterus.    Extraocular Movements: Extraocular movements intact.     Conjunctiva/sclera: Conjunctivae normal.     Pupils: Pupils are equal, round, and reactive to light.  Neck:     Vascular: No carotid bruit.     Trachea: No tracheal deviation.  Cardiovascular:     Rate and Rhythm: Normal rate and regular rhythm.     Pulses: Normal pulses.     Heart sounds: Normal heart sounds. No murmur heard.    No friction rub. No gallop.  Pulmonary:     Effort: Pulmonary effort is normal. No respiratory distress.     Breath sounds: Normal breath sounds.  Abdominal:     General: Bowel sounds are normal. There is no distension.     Palpations: Abdomen is soft. There is no mass.     Tenderness: There is no abdominal tenderness. There is no guarding.  Genitourinary:    Comments: No cva tenderness.  Musculoskeletal:        General: No swelling or tenderness.     Cervical back: Normal range of motion and neck supple. No rigidity. No muscular tenderness.     Right lower leg: No edema.     Left lower leg: No edema.     Comments: LUE of normal color and warmth. No LUE swelling. Radial pulse 2+. LUE nvi. C spine non tender, aligned, normal rom.   Skin:    General: Skin is warm and dry.     Findings: No rash.  Neurological:     Mental Status: She is alert.     Comments: Alert, speech normal. GCS 15. Motor/sens grossly intact bil. Stre 5/5. No pronator drift. Sens intact. Steady gait. No ataxia.   Psychiatric:        Mood and Affect: Mood normal.      ED Results / Procedures / Treatments   Labs (all labs ordered are listed, but only abnormal results are displayed) Results for orders placed or performed during the hospital encounter of 05/01/23  Protime-INR  Result Value Ref Range   Prothrombin Time 12.5 11.4 - 15.2 seconds   INR 0.9 0.8 - 1.2  APTT  Result Value Ref Range   aPTT 24 24 - 36 seconds  CBC  Result Value Ref Range   WBC 9.0 4.0 - 10.5 K/uL   RBC 3.91 3.87 - 5.11 MIL/uL   Hemoglobin  12.4 12.0 - 15.0 g/dL   HCT 40.9 81.1 - 91.4 %   MCV 96.4 80.0 - 100.0 fL   MCH 31.7 26.0 - 34.0 pg   MCHC 32.9 30.0 - 36.0 g/dL   RDW 78.2 95.6 - 21.3 %   Platelets 373 150 - 400 K/uL   nRBC 0.0 0.0 - 0.2 %  Differential  Result Value Ref Range   Neutrophils Relative % 67 %   Neutro Abs 6.1 1.7 - 7.7 K/uL   Lymphocytes Relative 25 %   Lymphs Abs 2.3 0.7 - 4.0 K/uL   Monocytes Relative 6 %   Monocytes Absolute 0.5 0.1 - 1.0 K/uL   Eosinophils Relative 1 %   Eosinophils Absolute 0.1 0.0 - 0.5 K/uL   Basophils Relative 0 %   Basophils Absolute 0.0 0.0 - 0.1 K/uL   Immature Granulocytes 1 %   Abs Immature Granulocytes 0.05 0.00 - 0.07 K/uL  Comprehensive metabolic panel  Result Value Ref Range   Sodium 136 135 - 145 mmol/L   Potassium 4.3 3.5 - 5.1 mmol/L   Chloride 101 98 - 111 mmol/L   CO2 21 (L) 22 - 32 mmol/L   Glucose, Bld 218 (H) 70 - 99 mg/dL   BUN 14 6 - 20 mg/dL   Creatinine, Ser 0.86 (H) 0.44 - 1.00 mg/dL   Calcium 9.2 8.9 - 57.8 mg/dL   Total Protein 7.1 6.5 - 8.1 g/dL   Albumin 3.4 (L) 3.5 - 5.0 g/dL   AST 29 15 - 41 U/L   ALT 24 0 - 44 U/L   Alkaline Phosphatase 64 38 - 126 U/L   Total Bilirubin 0.6 0.3 - 1.2 mg/dL   GFR, Estimated >46 >96 mL/min   Anion gap 14 5 - 15  Ethanol  Result Value Ref Range   Alcohol, Ethyl (B) <10 <10 mg/dL  hCG, serum, qualitative  Result Value Ref Range   Preg, Serum NEGATIVE NEGATIVE  I-stat chem 8, ED  Result Value Ref Range   Sodium 138 135 - 145 mmol/L    Potassium 4.6 3.5 - 5.1 mmol/L   Chloride 105 98 - 111 mmol/L   BUN 16 6 - 20 mg/dL   Creatinine, Ser 2.95 0.44 - 1.00 mg/dL   Glucose, Bld 284 (H) 70 - 99 mg/dL   Calcium, Ion 1.32 (L) 1.15 - 1.40 mmol/L   TCO2 24 22 - 32 mmol/L   Hemoglobin 12.9 12.0 - 15.0 g/dL   HCT 44.0 10.2 - 72.5 %  CBG monitoring, ED  Result Value Ref Range   Glucose-Capillary 217 (H) 70 - 99 mg/dL  Troponin I (High Sensitivity)  Result Value Ref Range   Troponin I (High Sensitivity) 7 <18 ng/L     EKG EKG Interpretation Date/Time:  Wednesday May 01 2023 10:52:58 EDT Ventricular Rate:  70 PR Interval:  117 QRS Duration:  87 QT Interval:  460 QTC Calculation: 497 R Axis:   45  Text Interpretation: Sinus rhythm Ventricular premature complex Borderline short PR interval Borderline prolonged QT interval Nonspecific T wave abnormality Confirmed by Cathren Laine ([id number]) on 05/01/2023 10:55:09 AM  Radiology MR BRAIN WO CONTRAST  Result Date: 05/01/2023 CLINICAL DATA:  Left-sided numbness. Stroke suspected. Hypertension. EXAM: MRI HEAD WITHOUT CONTRAST TECHNIQUE: Multiplanar, multiecho pulse sequences of the brain and surrounding structures were obtained without intravenous contrast. COMPARISON:  Head CT earlier same day FINDINGS: Brain: Diffusion imaging does not show any acute or subacute infarction. Chronic small-vessel ischemic changes affect pons.  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 Mild chronic small-vessel ischemic changes of the cerebral hemispheric white matter. No cortical or large vessel territory infarction. No mass lesion, hemorrhage, hydrocephalus or extra-axial collection. Vascular: Major vessels at the base of the brain show flow. Skull and upper cervical spine: Negative Sinuses/Orbits: Clear/normal Other: None IMPRESSION: No acute finding. Chronic small-vessel ischemic changes of the pons and cerebral hemispheric white matter. 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  Electronically Signed   By: Paulina Fusi M.D.   On: 05/01/2023 13:56   CT HEAD CODE STROKE WO CONTRAST  Result Date: 05/01/2023 CLINICAL DATA:  Code stroke.  Left-sided numbness EXAM: CT HEAD WITHOUT CONTRAST TECHNIQUE: Contiguous axial images were obtained from the base of the skull through the vertex without intravenous contrast. RADIATION DOSE REDUCTION: This exam was performed according to the departmental dose-optimization program which includes automated exposure control, adjustment of the mA and/or kV according to patient size and/or use of iterative reconstruction technique. COMPARISON:  None Available. FINDINGS: Brain: There is no acute intracranial hemorrhage, extra-axial fluid collection, or acute infarct Parenchymal volume is normal. The ventricles are normal in size. Gray-white differentiation is preserved. A small remote infarct in the right lentiform nucleus is unchanged since 2021. An additional small infarct in the anterior limb of the right internal capsule is new since that study but is favored remote. The pituitary and suprasellar region are normal. There is no mass lesion. There is no mass effect or midline shift. Vascular: There is calcification of the bilateral carotid siphons. Skull: Normal. Negative for fracture or focal lesion. Sinuses/Orbits: The imaged paranasal sinuses are clear. The imaged globes and orbits are unremarkable. Other: The mastoid air cells and middle ear cavities are clear. ASPECTS [facility] Stroke Program Early CT Score) - Ganglionic level infarction (caudate, lentiform nuclei, internal capsule, insula, M1-M3 cortex): 7 - Supraganglionic infarction (M4-M6 cortex): 3 Total score (0-10 with 10 being normal): 10 IMPRESSION: 1. No acute intracranial hemorrhage or acute territorial infarct. 2. Small infarct in the anterior limb of the right internal capsule is age-indeterminate and new since 2021, but is favored remote. An additional small infarct in the right lentiform  nucleus is unchanged since 2021. Findings discussed with Dr Derry Lory at 10:59 am. Electronically Signed   By: Lesia Hausen M.D.   On: 05/01/2023 11:34    Procedures Procedures    Medications Ordered in ED Medications  hydrALAZINE (APRESOLINE) injection 10 mg (has no administration in time range)  sodium chloride flush (NS) 0.9 % injection 3 mL (3 mLs Intravenous Given 05/01/23 1030)  acetaminophen (TYLENOL) tablet 1,000 mg (1,000 mg Oral Given 05/01/23 1347)  labetalol (NORMODYNE) injection 20 mg (20 mg Intravenous Given 05/01/23 1403)    ED Course/ Medical Decision Making/ A&P                                 Medical Decision Making Problems Addressed: Elevated blood pressure reading: acute illness or injury Essential hypertension: chronic illness or injury with exacerbation, progression, or side effects of treatment that poses a threat to life or bodily functions Hyperglycemia: acute illness or injury with systemic symptoms Nausea: acute illness or injury Near syncope: acute illness or injury with systemic symptoms that poses a threat to life or bodily functions Paresthesia: acute illness or injury with systemic symptoms that poses a threat to life or bodily functions Uncontrolled hypertension: acute illness or injury with systemic  symptoms that poses a threat to life or bodily functions  Amount and/or Complexity of Data Reviewed Independent Historian: EMS    Details: hx External Data Reviewed: labs and notes. Labs: ordered. Decision-making details documented in ED Course. Radiology: ordered and independent interpretation performed. Decision-making details documented in ED Course. ECG/medicine tests: ordered and independent interpretation performed. Decision-making details documented in ED Course.  Risk OTC drugs. Prescription drug management. Decision regarding hospitalization.   Iv ns. Continuous pulse ox and cardiac monitoring. Labs ordered/sent. Imaging ordered.    Differential diagnosis includes cva, tia, near syncope, anemia, dehydration, etc. Dispo decision including potential need for admission considered - will get labs and imaging and reassess.   Reviewed nursing notes and prior charts for additional history. External reports reviewed. Additional history from: EMS.  Cardiac monitor: sinus rhythm, rate 70.  Labs reviewed/interpreted by me - wbc and hgb normal. Lytes normal. Glucose mildly high.   CT reviewed/interpreted by me - no hem.   MRI reviewed/interpreted by me - no cva.  Neurology consulted on arrival as code stroke activation - they evaluated and feel if mri neg, no further neuro workup needed acutely.   Bp elevated. Pt did take her am bp med and indicates has adequate supply at home. Bp remains high. Labetalol iv.   Po fluids/food.   Bp improved but high. No new symptoms or neuro findings on exam. Pt took her AM meds, but not her evening meds, will give dose of her evening bp meds. Po fluids.  Recheck bp improved from prior. No headache. No new numbness/weakness on exam, no new focal deficit noted. Neurology rec outpatient f/u.  Pt currently appears stable for d/c.   Rec close pcp f/u.  Return precautions provided.          Final Clinical Impression(s) / ED Diagnoses Final diagnoses:  Near syncope  Nausea  Paresthesia  Essential hypertension  Elevated blood pressure reading  Hyperglycemia  Uncontrolled hypertension    Rx / DC Orders ED Discharge Orders     None             Cathren Laine, MD 05/01/23 1651

## 2023-05-09 ENCOUNTER — Ambulatory Visit: Payer: Managed Care, Other (non HMO)

## 2023-05-13 ENCOUNTER — Ambulatory Visit (INDEPENDENT_AMBULATORY_CARE_PROVIDER_SITE_OTHER): Payer: Managed Care, Other (non HMO) | Admitting: Family Medicine

## 2023-05-13 ENCOUNTER — Encounter: Payer: Self-pay | Admitting: Family Medicine

## 2023-05-13 VITALS — BP 131/84 | HR 80 | Ht 64.0 in | Wt 190.8 lb

## 2023-05-13 DIAGNOSIS — L732 Hidradenitis suppurativa: Secondary | ICD-10-CM | POA: Diagnosis not present

## 2023-05-13 DIAGNOSIS — I10 Essential (primary) hypertension: Secondary | ICD-10-CM | POA: Diagnosis not present

## 2023-05-13 DIAGNOSIS — F4321 Adjustment disorder with depressed mood: Secondary | ICD-10-CM | POA: Diagnosis not present

## 2023-05-13 DIAGNOSIS — R739 Hyperglycemia, unspecified: Secondary | ICD-10-CM | POA: Diagnosis not present

## 2023-05-13 DIAGNOSIS — Z09 Encounter for follow-up examination after completed treatment for conditions other than malignant neoplasm: Secondary | ICD-10-CM

## 2023-05-13 LAB — POCT GLYCOSYLATED HEMOGLOBIN (HGB A1C): Hemoglobin A1C: 5.5 % (ref 4.0–5.6)

## 2023-05-13 MED ORDER — VERAPAMIL HCL ER 240 MG PO TBCR
240.0000 mg | EXTENDED_RELEASE_TABLET | Freq: Every day | ORAL | 0 refills | Status: DC
Start: 2023-05-13 — End: 2024-01-30

## 2023-05-13 MED ORDER — SERTRALINE HCL 50 MG PO TABS
50.0000 mg | ORAL_TABLET | Freq: Every day | ORAL | 0 refills | Status: DC
Start: 2023-05-13 — End: 2024-08-17

## 2023-05-13 MED ORDER — CARVEDILOL 6.25 MG PO TABS
6.2500 mg | ORAL_TABLET | Freq: Two times a day (BID) | ORAL | 1 refills | Status: DC
Start: 2023-05-13 — End: 2024-01-30

## 2023-05-13 MED ORDER — SPIRONOLACTONE 50 MG PO TABS
50.0000 mg | ORAL_TABLET | Freq: Every day | ORAL | 0 refills | Status: DC
Start: 2023-05-13 — End: 2024-01-30

## 2023-05-13 MED ORDER — LOSARTAN POTASSIUM 100 MG PO TABS
100.0000 mg | ORAL_TABLET | Freq: Every day | ORAL | 0 refills | Status: DC
Start: 2023-05-13 — End: 2023-07-19

## 2023-05-13 MED ORDER — SULFAMETHOXAZOLE-TRIMETHOPRIM 800-160 MG PO TABS
1.0000 | ORAL_TABLET | Freq: Two times a day (BID) | ORAL | 0 refills | Status: DC
Start: 2023-05-13 — End: 2023-07-19

## 2023-05-13 NOTE — Patient Instructions (Signed)
It was great to see you!  Our plans for today:  - We sent refills to your pharmacy.  - Make an appointment to follow up for your blood pressure soon.  - Follow up with Neurology as scheduled.   Take care and seek immediate care sooner if you develop any concerns.   Dr. Linwood Dibbles

## 2023-05-13 NOTE — Progress Notes (Signed)
   SUBJECTIVE:   CHIEF COMPLAINT / HPI:   Hospital follow-up - seen at [facility] 8/7 for near syncope - "felt weak, faint, numbness/tingling to L arm/hand." Code stroke called. Neuro exam wnl. BP initially high, improved with antihypertensives. Blood glucose elevated. CT Head NAICA. MRI brain chronic small vessel disease. Neuro recommended outpt f/u. - no numbness or tingling since - did have energy drink, coffee that day and honeybun which likely elevated blood sugar. - has neuro appt 10/25.  OBJECTIVE:   BP 131/84   Pulse 80   Wt 190 lb 12.8 oz (86.5 kg)   SpO2 94%   BMI 32.75 kg/m   Gen: well appearing, in NAD Card: RRR Lungs: CTAB MSK: Full ROM, strength 5/5 to U/LE bilaterally, normal gait.  No edema.  Neuro: Alert and oriented, speech normal.   Intact symmetric sensation to light touch of face and extremities bilaterally.  Hearing grossly intact bilaterally.   Ext: WWP, no edema   ASSESSMENT/PLAN:   Hospital follow up Symptoms now resolved. Exam today wnl. BP at goal. A1c normal. Symptoms likely 2/2 caffeine and sugar intake as above. Maintain neuro f/u. Due for PCP follow up for chronic disease, encouraged to make appt. Refills provided.    Caro Laroche, DO

## 2023-07-19 ENCOUNTER — Encounter: Payer: Self-pay | Admitting: Diagnostic Neuroimaging

## 2023-07-19 ENCOUNTER — Ambulatory Visit (INDEPENDENT_AMBULATORY_CARE_PROVIDER_SITE_OTHER): Payer: Managed Care, Other (non HMO) | Admitting: Diagnostic Neuroimaging

## 2023-07-19 VITALS — BP 190/100 | HR 66 | Ht 63.0 in | Wt 190.0 lb

## 2023-07-19 DIAGNOSIS — G459 Transient cerebral ischemic attack, unspecified: Secondary | ICD-10-CM | POA: Diagnosis not present

## 2023-07-19 DIAGNOSIS — I161 Hypertensive emergency: Secondary | ICD-10-CM | POA: Diagnosis not present

## 2023-07-19 DIAGNOSIS — E782 Mixed hyperlipidemia: Secondary | ICD-10-CM | POA: Diagnosis not present

## 2023-07-19 MED ORDER — ASPIRIN 81 MG PO TBEC: 81.0000 mg | DELAYED_RELEASE_TABLET | Freq: Every day | ORAL | Status: AC

## 2023-07-19 MED ORDER — TOPIRAMATE 50 MG PO TABS: 50.0000 mg | ORAL_TABLET | Freq: Two times a day (BID) | ORAL | 12 refills | Status: AC

## 2023-07-19 MED ORDER — NURTEC 75 MG PO TBDP: 75.0000 mg | ORAL_TABLET | Freq: Every day | ORAL | 6 refills | Status: DC | PRN

## 2023-07-19 NOTE — Progress Notes (Signed)
[facility]  PATIENT: Anne Gonzalez DOB: 12/20/1975  REFERRING CLINICIAN: No ref. provider found HISTORY FROM: patient  REASON FOR VISIT: new consult   HISTORICAL  CHIEF COMPLAINT:  Chief Complaint  Patient presents with   Room 7    Pt is here Alone. Pt states that she had some dizziness and then some tingling in her arm and then she loss consciousness. Pt states that she has migraines. Pt states that she will have them 2-3 days per month. Pt states that she gets white aura with her migraines. Pt states that after her migraines her head will be sensitive to touch. Pt states that she is on Imitrex and it makes her sick on her stomach and doesn't help with her migraines. Pt states that she has a history of Aneurysms in her family.      HISTORY OF PRESENT ILLNESS:   47 year old female here for evaluation of syncope, hypertension, left-sided numbness.  Patient was at work when all of a sudden she had left arm numbness and weakness, lightheadedness.  She ended up briefly passing out.  Once she came to the left arm symptoms had resolved.  She was taken to the emergency room and systolic blood pressure was 240.  CT head, MRI brain were unremarkable.  Patient has remote history of migraine with aura, with right greater than left-sided headaches, throbbing sensation, sensitivity to light and nausea.  These started around age 64 years old.  She has been on Imitrex in the past which caused nausea.      REVIEW OF SYSTEMS: Full 14 system review of systems performed and negative with exception of: as per HPI.  ALLERGIES: Allergies  Allergen Reactions   Aspirin Other (See Comments)    Upset Stomach    Pineapple Other (See Comments)    Mouth swells, occurred only after pregnancy   Amlodipine Swelling    Issues with swelling even at 5mg  dose   Hctz [Hydrochlorothiazide] Swelling and Other (See Comments)    Causes swelling and headaches    Lisinopril Cough    Doxycycline Rash    HOME MEDICATIONS: Outpatient Medications Prior to Visit  Medication Sig Dispense Refill   atorvastatin (LIPITOR) 20 MG tablet Take 1 tablet (20 mg total) by mouth daily. 30 tablet 3   carvedilol (COREG) 6.25 MG tablet Take 1 tablet (6.25 mg total) by mouth 2 (two) times daily with a meal. 180 tablet 1   omeprazole (PRILOSEC) 20 MG capsule Take 1 capsule (20 mg total) by mouth daily. 30 capsule 2   sertraline (ZOLOFT) 50 MG tablet Take 1 tablet (50 mg total) by mouth daily. 90 tablet 0   spironolactone (ALDACTONE) 50 MG tablet Take 1 tablet (50 mg total) by mouth daily. 90 tablet 0   verapamil (CALAN-SR) 240 MG CR tablet Take 1 tablet (240 mg total) by mouth at bedtime. 90 tablet 0   losartan (COZAAR) 100 MG tablet Take 1 tablet (100 mg total) by mouth daily. 90 tablet 0   Capsaicin (CAPSAICIN HP) 0.1 % CREA Apply to painful left back area as needed for pain. Wash hands afterwards. (Patient not taking: Reported on 07/19/2023) 42.5 g 0   ibuprofen (ADVIL) 600 MG tablet Take 1 tablet (600 mg total) by mouth every 8 (eight) hours as needed. (Patient not taking: Reported on 07/19/2023) 60 tablet 0   Lidocaine 4 % GEL Apply to painful area every 6 hours as needed. (Patient not taking: Reported on 07/19/2023) 113 g 0  methocarbamol (ROBAXIN) 500 MG tablet Take 1 tablet (500 mg total) by mouth 2 (two) times daily. (Patient not taking: Reported on 07/19/2023) 20 tablet 0   sulfamethoxazole-trimethoprim (BACTRIM DS) 800-160 MG tablet Take 1 tablet by mouth 2 (two) times daily. (Patient not taking: Reported on 07/19/2023) 14 tablet 0   SUMAtriptan (IMITREX) 100 MG tablet Take 1 tablet (100 mg total) by mouth every 2 (two) hours as needed for migraine. May repeat in 2 hours if headache persists or recurs. (Patient not taking: Reported on 07/19/2023) 30 tablet 1   No facility-administered medications prior to visit.    PAST MEDICAL HISTORY: Past Medical History:  Diagnosis Date    Acid reflux 2005   Environmental allergies 2019   Hypertension    Irregular heart beat 2019   Migraine 2019   Sleep apnea 2019    PAST SURGICAL HISTORY: Past Surgical History:  Procedure Laterality Date   THYROID SURGERY  2012   TUBAL LIGATION  2009    FAMILY HISTORY: Family History  Problem Relation Age of Onset   Diabetes Father    Hypertension Father    Heart failure Father    Alcohol abuse Father    Drug abuse Father    Depression Father    Hyperlipidemia Father    Sickle cell anemia Sister     SOCIAL HISTORY: Social History   Socioeconomic History   Marital status: Divorced    Spouse name: Not on file   Number of children: Not on file   Years of education: Not on file   Highest education level: Not on file  Occupational History   Not on file  Tobacco Use   Smoking status: Every Day    Types: Cigarettes    Start date: 7   Smokeless tobacco: Never  Substance and Sexual Activity   Alcohol use: Yes    Comment: occasional wine   Drug use: No   Sexual activity: Not on file  Other Topics Concern   Not on file  Social History Narrative   Not on file   Social Determinants of Health   Financial Resource Strain: Not on file  Food Insecurity: Not on file  Transportation Needs: Not on file  Physical Activity: Not on file  Stress: Not on file  Social Connections: Not on file  Intimate Partner Violence: Not on file     PHYSICAL EXAM  GENERAL EXAM/CONSTITUTIONAL: Vitals:  Vitals:   07/19/23 0847 07/19/23 0900  BP: (!) 210/127 (!) 190/100  Pulse: 76 66  SpO2:  96%  Weight: 190 lb (86.2 kg)   Height: 5\' 3"  (1.6 m)    Body mass index is 33.66 kg/m. Wt Readings from Last 3 Encounters:  07/19/23 190 lb (86.2 kg)  05/13/23 190 lb 12.8 oz (86.5 kg)  05/01/23 196 lb 10.4 oz (89.2 kg)   Patient is in no distress; well developed, nourished and groomed; neck is supple  CARDIOVASCULAR: Examination of carotid arteries is normal; no carotid  bruits Regular rate and rhythm, no murmurs Examination of peripheral vascular system by observation and palpation is normal  EYES: Ophthalmoscopic exam of optic discs and posterior segments is normal; no papilledema or hemorrhages No results found.  MUSCULOSKELETAL: Gait, strength, tone, movements noted in Neurologic exam below  NEUROLOGIC: MENTAL STATUS:      No data to display         awake, alert, oriented to person, place and time recent and remote memory intact normal attention and concentration language fluent,  comprehension intact, naming intact fund of knowledge appropriate  CRANIAL NERVE:  2nd - no papilledema on fundoscopic exam 2nd, 3rd, 4th, 6th - pupils equal and reactive to light, visual fields full to confrontation, extraocular muscles intact, no nystagmus 5th - facial sensation symmetric 7th - facial strength symmetric 8th - hearing intact 9th - palate elevates symmetrically, uvula midline 11th - shoulder shrug symmetric 12th - tongue protrusion midline  MOTOR:  normal bulk and tone, full strength in the BUE, BLE  SENSORY:  normal and symmetric to light touch, temperature, vibration  COORDINATION:  finger-nose-finger, fine finger movements normal  REFLEXES:  deep tendon reflexes present and symmetric  GAIT/STATION:  narrow based gait     DIAGNOSTIC DATA (LABS, IMAGING, TESTING) - I reviewed patient records, labs, notes, testing and imaging myself where available.  Lab Results  Component Value Date   WBC 9.0 05/01/2023   HGB 12.9 05/01/2023   HCT 38.0 05/01/2023   MCV 96.4 05/01/2023   PLT 373 05/01/2023      Component Value Date/Time   NA 138 05/01/2023 1036   NA 141 12/14/2022 1220   K 4.6 05/01/2023 1036   CL 105 05/01/2023 1036   CO2 21 (L) 05/01/2023 1034   GLUCOSE 224 (H) 05/01/2023 1036   BUN 16 05/01/2023 1036   BUN 15 12/14/2022 1220   CREATININE 1.00 05/01/2023 1036   CALCIUM 9.2 05/01/2023 1034   PROT 7.1 05/01/2023  1034   PROT 6.8 08/03/2020 1121   ALBUMIN 3.4 (L) 05/01/2023 1034   ALBUMIN 4.2 08/03/2020 1121   AST 29 05/01/2023 1034   ALT 24 05/01/2023 1034   ALKPHOS 64 05/01/2023 1034   BILITOT 0.6 05/01/2023 1034   BILITOT 0.3 08/03/2020 1121   GFRNONAA >60 05/01/2023 1034   GFRAA 89 08/03/2020 1121   Lab Results  Component Value Date   CHOL 258 (H) 01/19/2022   HDL 66 01/19/2022   LDLCALC 175 (H) 01/19/2022   TRIG 96 01/19/2022   CHOLHDL 3.9 01/19/2022   Lab Results  Component Value Date   HGBA1C 5.5 05/13/2023   Lab Results  Component Value Date   VITAMINB12 393 04/11/2018   Lab Results  Component Value Date   TSH 1.430 08/03/2020    05/01/23 CT head  1. No acute intracranial hemorrhage or acute territorial infarct. 2. Small infarct in the anterior limb of the right internal capsule is age-indeterminate and new since 2021, but is favored remote. An additional small infarct in the right lentiform nucleus is unchanged since 2021.  05/01/23 MRI brain  No acute finding. Chronic small-vessel ischemic changes of the pons and cerebral hemispheric white matter. 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    ASSESSMENT AND PLAN  47 y.o. year old female here with:   Dx:  1. TIA (transient ischemic attack)   2. Hypertensive emergency   3. Mixed hyperlipidemia      PLAN:  SYNCOPE / HYPERTENSIVE EMERGENCY / LEFT ARM NUMBNESS (05/01/23)  - patient tells me that she actually passed out; different that ER and PCP notes; this could have been hypertensive emergency event with TIA symptoms  - recommend cardiology evaluation (for syncope event)  - will check CTA head / neck and echocardiogram (TIA evaluation)  - start aspirin 81mg  daily  - continue BP control  - consider increase atorvastatin to 80mg  daily and follow up with PCP (last LDL 175)  - According to [place] law, you can not drive unless you are syncope free  for at least 6 months and under physician's care, for  unprovoked syncope events. May discuss with cardiology and PCP to see if this can be reduced to 3 months after additional testing and treatment. Unclear if this was provoked by hypertensive emergency and energy drink and stress, or if it was unprovoked.  - Please maintain precautions. Do not participate in activities where a loss of awareness could harm you or someone else. No swimming alone, no tub bathing, no hot tubs, no driving, no operating motorized vehicles (cars, ATVs, motocycles, etc), lawnmowers, power tools or firearms. No standing at heights, such as rooftops, ladders or stairs. Avoid hot objects such as stoves, heaters, open fires. Wear a helmet when riding a bicycle, scooter, skateboard, etc. and avoid areas of traffic. Set your water heater to 120 degrees or less.    MIGRAINE WITH AURA  - has tried ibuprofen, tylenol, sumatriptan in past  - stop triptans (due to HTN, stroke risk)  - continue coreg and verapamil  - start topiramate 50mg  twice a day for migraine prevention  - start nurtec 75mg  as needed for migraine rescue   Orders Placed This Encounter  Procedures   CT ANGIO HEAD W OR WO CONTRAST   CT ANGIO NECK W OR WO CONTRAST   Ambulatory referral to Sleep Studies   ECHOCARDIOGRAM COMPLETE BUBBLE STUDY   Meds ordered this encounter  Medications   topiramate (TOPAMAX) 50 MG tablet    Sig: Take 1 tablet (50 mg total) by mouth 2 (two) times daily.    Dispense:  60 tablet    Refill:  12   Rimegepant Sulfate (NURTEC) 75 MG TBDP    Sig: Take 1 tablet (75 mg total) by mouth daily as needed.    Dispense:  8 tablet    Refill:  6   aspirin EC 81 MG tablet    Sig: Take 1 tablet (81 mg total) by mouth daily. Swallow whole.   Return in about 3 months (around 10/19/2023) for MyChart visit (15 min).  I reviewed images, labs, notes, records myself. I summarized findings and reviewed with patient, for this high risk condition (hypertensive emergency / TIA) requiring high  complexity decision making.    Suanne Marker, MD 07/19/2023, 10:01 AM Certified in Neurology, Neurophysiology and Neuroimaging  [facility] [address] [place], [place] [address] [phone]

## 2023-07-19 NOTE — Patient Instructions (Addendum)
SYNCOPE / HYPERTENSIVE EMERGENCY / LEFT ARM NUMBNESS (August 2024)  - patient tells me that she actually passed out; different that ER and PCP notes; this could have been hypertensive emergency event with TIA symptoms  - recommend cardiology evaluation  - will check CTA head / neck and echocardiogram (TIA evaluation)  - start aspirin 81mg  daily  - continue BP control  - consider increase atorvastatin to 80mg  daily and follow up with PCP (last LDL 175)  - According to [place] law, you can not drive unless you are syncope free for at least 6 months and under physician's care, for unprovoked syncope events. May discuss with cardiology and PCP to see if this can be reduced to 3 months after additional testing and treatment. Unclear if this was provoked by hypertensive emergency and energy drink and stress, or if it was unprovoked.  - Please maintain precautions. Do not participate in activities where a loss of awareness could harm you or someone else. No swimming alone, no tub bathing, no hot tubs, no driving, no operating motorized vehicles (cars, ATVs, motocycles, etc), lawnmowers, power tools or firearms. No standing at heights, such as rooftops, ladders or stairs. Avoid hot objects such as stoves, heaters, open fires. Wear a helmet when riding a bicycle, scooter, skateboard, etc. and avoid areas of traffic. Set your water heater to 120 degrees or less.    MIGRAINE WITH AURA  - has tried ibuprofen, tylenol, sumatriptan in past  - stop triptans (due to HTN, stroke risk)  - continue coreg and verapamil  - start topiramate 50mg  twice a day for migraine prevention  - start nurtec 75mg  as needed for migraine rescue

## 2023-07-24 ENCOUNTER — Telehealth: Payer: Self-pay | Admitting: Pharmacist

## 2023-07-24 NOTE — Telephone Encounter (Signed)
Patient contacted for follow-up of recent ER visit and Neurology visit with elevated blood pressure.   Since last contact patient reports she is taking all of her blood pressure, cholesterol and other prescribed medications with exception of Prilosec (omeprazole).  She requested refill.    She reports multiple pending tests/scans.  Following completion of scans she plans to follow-up with Dr. PCP here at [facility].  Reports adherence.  No pharmacist visit scheduled at this time.  Happy to see following PCP visit if blood pressure remains higher than goal.    Total time with patient call and documentation of interaction: 11 minutes.

## 2023-07-25 NOTE — Telephone Encounter (Signed)
Reviewed and agree with Dr Koval's plan.   

## 2023-08-01 ENCOUNTER — Other Ambulatory Visit: Payer: Self-pay | Admitting: Diagnostic Neuroimaging

## 2023-08-01 ENCOUNTER — Ambulatory Visit
Admission: RE | Admit: 2023-08-01 | Discharge: 2023-08-01 | Disposition: A | Discharge status: Home / Self Care | Payer: Managed Care, Other (non HMO) | Source: Ambulatory Visit | Attending: Diagnostic Neuroimaging | Admitting: Diagnostic Neuroimaging

## 2023-08-01 DIAGNOSIS — E782 Mixed hyperlipidemia: Secondary | ICD-10-CM

## 2023-08-01 DIAGNOSIS — I161 Hypertensive emergency: Secondary | ICD-10-CM

## 2023-08-01 DIAGNOSIS — G459 Transient cerebral ischemic attack, unspecified: Secondary | ICD-10-CM

## 2023-08-01 MED ORDER — IOPAMIDOL (ISOVUE-370) INJECTION 76%
75.0000 mL | Freq: Once | INTRAVENOUS | Status: AC | PRN
  Administered 2023-08-01: 75 mL via INTRAVENOUS

## 2023-08-13 ENCOUNTER — Ambulatory Visit (HOSPITAL_COMMUNITY): Payer: Managed Care, Other (non HMO) | Attending: Diagnostic Neuroimaging

## 2023-08-13 DIAGNOSIS — G459 Transient cerebral ischemic attack, unspecified: Secondary | ICD-10-CM

## 2023-08-13 DIAGNOSIS — I161 Hypertensive emergency: Secondary | ICD-10-CM

## 2023-08-13 LAB — ECHOCARDIOGRAM COMPLETE BUBBLE STUDY
Calc EF: 51.1 %
S' Lateral: 2.61 cm
Single Plane A2C EF: 51.4 %
Single Plane A4C EF: 52.7 %

## 2023-08-20 ENCOUNTER — Encounter: Payer: Self-pay | Admitting: Neurology

## 2023-08-20 ENCOUNTER — Ambulatory Visit (INDEPENDENT_AMBULATORY_CARE_PROVIDER_SITE_OTHER): Payer: Managed Care, Other (non HMO) | Admitting: Neurology

## 2023-08-20 VITALS — BP 140/92 | HR 80 | Ht 63.0 in | Wt 193.0 lb

## 2023-08-20 DIAGNOSIS — G459 Transient cerebral ischemic attack, unspecified: Secondary | ICD-10-CM

## 2023-08-20 DIAGNOSIS — F17200 Nicotine dependence, unspecified, uncomplicated: Secondary | ICD-10-CM

## 2023-08-20 DIAGNOSIS — R635 Abnormal weight gain: Secondary | ICD-10-CM

## 2023-08-20 DIAGNOSIS — R0683 Snoring: Secondary | ICD-10-CM | POA: Diagnosis not present

## 2023-08-20 DIAGNOSIS — I161 Hypertensive emergency: Secondary | ICD-10-CM

## 2023-08-20 DIAGNOSIS — G4733 Obstructive sleep apnea (adult) (pediatric): Secondary | ICD-10-CM

## 2023-08-20 DIAGNOSIS — E782 Mixed hyperlipidemia: Secondary | ICD-10-CM

## 2023-08-20 DIAGNOSIS — G4719 Other hypersomnia: Secondary | ICD-10-CM | POA: Diagnosis not present

## 2023-08-20 DIAGNOSIS — E66811 Obesity, class 1: Secondary | ICD-10-CM

## 2023-08-20 NOTE — Progress Notes (Signed)
Subjective:    Patient ID: Anne Gonzalez is a 47 y.o. female.  HPI    Huston Foley, MD, PhD [facility] [address], [address] [address] [place], [place] [address]  Dear Anne Gonzalez,   I saw your patient, Anne Gonzalez, upon your kind request in my sleep clinic today for initial consultation of her sleep disorder, in particular, evaluation of her prior diagnosis of obstructive sleep apnea.  The patient is unaccompanied today.  As you know, Ms. [name] is a 47 year old female with an underlying medical history of TIA, hypertensive emergency, smoking, reflux disease, allergies, migraine headaches, status post partial thyroidectomy, and obesity, who was previously diagnosed with obstructive sleep apnea and placed on PAP therapy.  Her Epworth sleepiness score is 14 out of 24, fatigue severity score is 23 out of 63.  I reviewed your office note from 07/19/2023.  [place] when she was still residing in [place].  She recalls that she had mild sleep apnea at the time but does report that her symptoms have become worse.  She reports loud snoring and daytime somnolence as well as witnessed apneas and significant weight gain within the past 7 to 8 months in the realm of 20 pounds.  She lives with her 2 younger children who are teenagers.  She has 2 grown children as well.  She works as an Charity fundraiser in a Paediatric nurse.  She works from Monday through Friday, 8-4.  Bedtime is variable but generally around 10:30 PM and rise time around 6:30 AM.  She has nocturia about 3 times per average night and has had occasional morning headaches.  She limits her caffeine to 1 to 2 cups of soda per day.  She drinks alcohol occasionally.  She is trying to quit smoking but still smokes about half a pack per day or less.  Prior sleep study results are not available for my review today.  She no longer has a CPAP machine and has not used a PAP machine in  years.  She would be willing to get retested an restart PAP therapy.   Her Past Medical History Is Significant For: Past Medical History:  Diagnosis Date   Acid reflux 2005   Environmental allergies 2019   Hypertension    Irregular heart beat 2019   Migraine 2019   Sleep apnea 2019    Her Past Surgical History Is Significant For: Past Surgical History:  Procedure Laterality Date   THYROID SURGERY  2012   TUBAL LIGATION  2009    Her Family History Is Significant For: Family History  Problem Relation Age of Onset   Diabetes Father    Hypertension Father    Heart failure Father    Alcohol abuse Father    Drug abuse Father    Depression Father    Hyperlipidemia Father    Sickle cell anemia Sister     Her Social History Is Significant For: Social History   Socioeconomic History   Marital status: Divorced    Spouse name: Not on file   Number of children: Not on file   Years of education: Not on file   Highest education level: Not on file  Occupational History   Not on file  Tobacco Use   Smoking status: Every Day    Types: Cigarettes    Start date: 1990   Smokeless tobacco: Never  Substance and Sexual Activity   Alcohol use: Yes  Comment: occasional wine   Drug use: No   Sexual activity: Not on file  Other Topics Concern   Not on file  Social History Narrative   Not on file   Social Determinants of Health   Financial Resource Strain: Not on file  Food Insecurity: Not on file  Transportation Needs: Not on file  Physical Activity: Not on file  Stress: Not on file  Social Connections: Not on file    Her Allergies Are:  Allergies  Allergen Reactions   Aspirin Other (See Comments)    Upset Stomach    Pineapple Other (See Comments)    Mouth swells, occurred only after pregnancy   Amlodipine Swelling    Issues with swelling even at 5mg  dose   Hctz [Hydrochlorothiazide] Swelling and Other (See Comments)    Causes swelling and headaches     Lisinopril Cough   Doxycycline Rash  :   Her Current Medications Are:  Outpatient Encounter Medications as of 08/20/2023  Medication Sig   aspirin EC 81 MG tablet Take 1 tablet (81 mg total) by mouth daily. Swallow whole.   atorvastatin (LIPITOR) 20 MG tablet Take 1 tablet (20 mg total) by mouth daily.   carvedilol (COREG) 6.25 MG tablet Take 1 tablet (6.25 mg total) by mouth 2 (two) times daily with a meal.   omeprazole (PRILOSEC) 20 MG capsule Take 1 capsule (20 mg total) by mouth daily.   sertraline (ZOLOFT) 50 MG tablet Take 1 tablet (50 mg total) by mouth daily.   spironolactone (ALDACTONE) 50 MG tablet Take 1 tablet (50 mg total) by mouth daily.   topiramate (TOPAMAX) 50 MG tablet Take 1 tablet (50 mg total) by mouth 2 (two) times daily.   verapamil (CALAN-SR) 240 MG CR tablet Take 1 tablet (240 mg total) by mouth at bedtime.   Rimegepant Sulfate (NURTEC) 75 MG TBDP Take 1 tablet (75 mg total) by mouth daily as needed. (Patient not taking: Reported on 08/20/2023)   No facility-administered encounter medications on file as of 08/20/2023.  :   Review of Systems:  Out of a complete 14 point review of systems, all are reviewed and negative with the exception of these symptoms as listed below:   Review of Systems  Neurological:        Pt is here for Sleep consult. Pt states previous SS 10+ yrs ago. States has HTN. States. Pt states she feels fatigued and headaches.  ESS 14 FSS 23    Objective:  Neurological Exam  Physical Exam Physical Examination:   Vitals:   08/20/23 1448  BP: (!) 140/92  Pulse: 80    General Examination: The patient is a very pleasant 47 y.o. female in no acute distress. She appears well-developed and well-nourished and well groomed.   HEENT: Normocephalic, atraumatic, pupils are equal, round and reactive to light, extraocular tracking is good without limitation to gaze excursion or nystagmus noted. Hearing is grossly intact. Face is symmetric with  normal facial animation. Speech is clear with no dysarthria noted. There is no hypophonia. There is no lip, neck/head, jaw or voice tremor. Neck is supple with full range of passive and active motion. There are no carotid bruits on auscultation. Oropharynx exam reveals: mild mouth dryness, good dental hygiene and moderate airway crowding, due to small airway entry and thicker tongue.  Mallampati class III.  Tonsils about 1+ bilaterally, neck circumference 14-7/8 inches.  Minimal overbite noted.  Tongue protrudes centrally and palate elevates symmetrically.  Chest: Clear to  auscultation without wheezing, rhonchi or crackles noted.  Heart: S1+S2+0, regular and normal without murmurs, rubs or gallops noted.   Abdomen: Soft, non-tender and non-distended.  Extremities: There is no pitting edema in the distal lower extremities bilaterally.   Skin: Warm and dry without trophic changes noted.   Musculoskeletal: exam reveals no obvious joint deformities.   Neurologically:  Mental status: The patient is awake, alert and oriented in all 4 spheres. Her immediate and remote memory, attention, language skills and fund of knowledge are appropriate. There is no evidence of aphasia, agnosia, apraxia or anomia. Speech is clear with normal prosody and enunciation. Thought process is linear. Mood is normal and affect is normal.  Cranial nerves II - XII are as described above under HEENT exam.  Motor exam: Normal bulk, strength and tone is noted. There is no obvious action or resting tremor.  Fine motor skills and coordination: grossly intact.  Cerebellar testing: No dysmetria or intention tremor. There is no truncal or gait ataxia.  Sensory exam: intact to light touch in the upper and lower extremities.  Gait, station and balance: She stands easily. No veering to one side is noted. No leaning to one side is noted. Posture is age-appropriate and stance is narrow based. Gait shows normal stride length and normal  pace. No problems turning are noted.   Assessment and Plan:  In summary, Taleasha Luttrull is a very pleasant 47 y.o.-year old female with an underlying medical history of TIA, hypertensive emergency, smoking, reflux disease, allergies, migraine headaches, status post partial thyroidectomy, and obesity, whose history and physical exam are concerning for sleep disordered breathing, particularly obstructive sleep apnea (OSA).  She was diagnosed in the past with OSA and tried PAP therapy.  She admits that she was not necessarily fully consistent with treatment but would be willing to get reevaluated and work on OSA therapy.  She is very motivated to get treated.  I had a long chat with the patient about my findings and the diagnosis of sleep apnea, particularly OSA, its prognosis and treatment options. We talked about medical/conservative treatments, surgical interventions and non-pharmacological approaches for symptom control. I explained, in particular, the risks and ramifications of untreated moderate to severe OSA, especially with respect to developing cardiovascular disease down the road, including congestive heart failure (CHF), difficult to treat hypertension, cardiac arrhythmias (particularly A-fib), neurovascular complications including TIA, stroke and dementia. Even type 2 diabetes has, in part, been linked to untreated OSA. Symptoms of untreated OSA may include (but may not be limited to) daytime sleepiness, nocturia (i.e. frequent nighttime urination), memory problems, mood irritability and suboptimally controlled or worsening mood disorder such as depression and/or anxiety, lack of energy, lack of motivation, physical discomfort, as well as recurrent headaches, especially morning or nocturnal headaches. We talked about the importance of maintaining a healthy lifestyle and striving for healthy weight.  The importance of complete smoking cessation was also addressed.  In addition, we talked about the  importance of striving for and maintaining good sleep hygiene. I recommended a sleep study at this time. I outlined the differences between a laboratory attended sleep study which is considered more comprehensive and accurate over the option of a home sleep test (HST); the latter may lead to underestimation of sleep disordered breathing in some instances and does not help with diagnosing upper airway resistance syndrome and is not accurate enough to diagnose primary central sleep apnea typically. I outlined possible surgical and non-surgical treatment options of OSA, including the  use of a positive airway pressure (PAP) device (i.e. CPAP, AutoPAP/APAP or BiPAP in certain circumstances), a custom-made dental device (aka oral appliance, which would require a referral to a specialist dentist or orthodontist typically, and is generally speaking not considered for patients with full dentures or edentulous state), upper airway surgical options, such as traditional UPPP (which is not considered a first-line treatment) or the Inspire device (hypoglossal nerve stimulator, which would involve a referral for consultation with an ENT surgeon, after careful selection, following inclusion criteria - also not first-line treatment). I explained the PAP treatment option to the patient in detail, as this is generally considered first-line treatment.  The patient indicated that she would be willing to try PAP therapy, if the need arises. I explained the importance of being compliant with PAP treatment, not only for insurance purposes but primarily to improve patient's symptoms symptoms, and for the patient's long term health benefit, including to reduce Her cardiovascular risks longer-term.    We will pick up our discussion about the next steps and treatment options after testing.  We will keep her posted as to the test results by phone call and/or MyChart messaging where possible.  We will plan to follow-up in sleep clinic  accordingly as well.  I answered all her questions today and the patient was in agreement.   I encouraged her to call with any interim questions, concerns, problems or updates or email [place] through MyChart.  Generally speaking, sleep test authorizations may take up to 2 weeks, sometimes less, sometimes longer, the patient is encouraged to get in touch with [place] if they do not hear back from the sleep lab staff directly within the next 2 weeks.  Thank you very much for allowing me to participate in the care of this nice patient. If I can be of any further assistance to you please do not hesitate to talk to me.   Sincerely,   Huston Foley, MD, PhD

## 2023-08-20 NOTE — Patient Instructions (Addendum)

## 2023-08-21 DIAGNOSIS — Z8673 Personal history of transient ischemic attack (TIA), and cerebral infarction without residual deficits: Secondary | ICD-10-CM | POA: Insufficient documentation

## 2023-09-04 ENCOUNTER — Ambulatory Visit (INDEPENDENT_AMBULATORY_CARE_PROVIDER_SITE_OTHER): Payer: Managed Care, Other (non HMO) | Admitting: Neurology

## 2023-09-04 DIAGNOSIS — G459 Transient cerebral ischemic attack, unspecified: Secondary | ICD-10-CM

## 2023-09-04 DIAGNOSIS — G4733 Obstructive sleep apnea (adult) (pediatric): Secondary | ICD-10-CM | POA: Diagnosis not present

## 2023-09-04 DIAGNOSIS — I161 Hypertensive emergency: Secondary | ICD-10-CM

## 2023-09-04 DIAGNOSIS — E66811 Obesity, class 1: Secondary | ICD-10-CM

## 2023-09-04 DIAGNOSIS — R0683 Snoring: Secondary | ICD-10-CM

## 2023-09-04 DIAGNOSIS — G4719 Other hypersomnia: Secondary | ICD-10-CM

## 2023-09-04 DIAGNOSIS — E782 Mixed hyperlipidemia: Secondary | ICD-10-CM

## 2023-09-04 DIAGNOSIS — F17200 Nicotine dependence, unspecified, uncomplicated: Secondary | ICD-10-CM

## 2023-09-04 DIAGNOSIS — R635 Abnormal weight gain: Secondary | ICD-10-CM

## 2023-10-01 ENCOUNTER — Telehealth: Payer: Self-pay | Admitting: Neurology

## 2023-10-01 NOTE — Telephone Encounter (Signed)
 Updated insurance auth  HST [employer] no auth req ref # [id number] at 9:37 AM.

## 2023-10-04 ENCOUNTER — Encounter: Payer: Self-pay | Admitting: Family Medicine

## 2023-10-04 DIAGNOSIS — G4733 Obstructive sleep apnea (adult) (pediatric): Secondary | ICD-10-CM | POA: Insufficient documentation

## 2023-10-08 ENCOUNTER — Telehealth: Payer: Self-pay | Admitting: Neurology

## 2023-10-08 ENCOUNTER — Telehealth: Payer: Self-pay

## 2023-10-08 ENCOUNTER — Other Ambulatory Visit (HOSPITAL_COMMUNITY): Payer: Self-pay

## 2023-10-08 ENCOUNTER — Telehealth: Payer: Self-pay | Admitting: *Deleted

## 2023-10-08 NOTE — Telephone Encounter (Signed)
 Pharmacy Patient Advocate Encounter   Received notification from Physician's Office that prior authorization for Nurtec 75mg  ODT is required/requested.   Insurance verification completed.   The patient is insured through [facility] .   Per test claim: PA required; PA submitted to above mentioned insurance via Prompt PA Key/confirmation #/[id number] Status is pending

## 2023-10-08 NOTE — Telephone Encounter (Signed)
 Zott, Linnell Fulling, Otilio Jefferson, RN; [place], [place] Got It Thank  You

## 2023-10-08 NOTE — Telephone Encounter (Signed)
 Pt has called back re: results to sleep test.

## 2023-10-08 NOTE — Telephone Encounter (Signed)
 PA needed for Nurtec asap. This was ordered back in October. Pt has tried sumatriptan in the past. She had a recent TIA. Dr Marjory Lies has ordered Nurtec for pt to taken prn migraine.

## 2023-10-08 NOTE — Telephone Encounter (Signed)
 PA request has been Submitted. New Encounter created for follow up. For additional info see Pharmacy Prior Auth telephone encounter from 10/08/2023.

## 2023-10-08 NOTE — Telephone Encounter (Signed)
 The patient returned our call and I discussed with her the sleep study results. The patient is amenable to an urgent setup on autopap for severe OSA. She did not have a preference for DME. Order sent to [facility]. She will watch for call soon.  I sent her the contact info in mychart. The patient's questions were answered. She also mentioned that she is still waiting on Nurtec PA. I apologized. I don't see anything in chart about this but we will send to PA team right away. She thanked me for the call.   Report sent to Dr Margaret. Order sent to [facility]. Message sent separately to PA team about Nurtec.

## 2023-10-09 ENCOUNTER — Other Ambulatory Visit (HOSPITAL_COMMUNITY): Payer: Self-pay

## 2023-10-09 ENCOUNTER — Encounter: Payer: Self-pay | Admitting: *Deleted

## 2023-10-09 NOTE — Telephone Encounter (Signed)
 Pharmacy Patient Advocate Encounter  Received notification from [employer] that Prior Authorization for Nurtec has been APPROVED from 10/08/2023 to 10/06/2024. Ran test claim, Copay is $0. This test claim was processed through [facility]- copay amounts may vary at other pharmacies due to pharmacy/plan contracts, or as the patient moves through the different stages of their insurance plan.   PA #/Case ID/Reference #: [id number]

## 2023-10-09 NOTE — Telephone Encounter (Signed)
 Sent my chart message informing pt of approval.

## 2023-10-21 ENCOUNTER — Telehealth (INDEPENDENT_AMBULATORY_CARE_PROVIDER_SITE_OTHER): Payer: Managed Care, Other (non HMO) | Admitting: Diagnostic Neuroimaging

## 2023-10-21 ENCOUNTER — Encounter: Payer: Self-pay | Admitting: Diagnostic Neuroimaging

## 2023-10-21 DIAGNOSIS — G43009 Migraine without aura, not intractable, without status migrainosus: Secondary | ICD-10-CM

## 2023-10-21 DIAGNOSIS — G459 Transient cerebral ischemic attack, unspecified: Secondary | ICD-10-CM

## 2023-10-21 NOTE — Progress Notes (Signed)
[facility]  PATIENT: Anne Gonzalez DOB: 08-08-1976  REFERRING CLINICIAN: McDiarmid, Leighton Roach, MD HISTORY FROM: patient  REASON FOR VISIT: follow up   HISTORICAL  CHIEF COMPLAINT:  Chief Complaint  Patient presents with   Migraine        Transient Ischemic Attack         HISTORY OF PRESENT ILLNESS:   UPDATE (10/21/23, VRP): Since last visit, doing well. Symptoms are improving. Now on CPAP x 1 week. Also on TPX and HA are improving. No recurrent TIA symptoms.   PRIOR HPI (07/19/23): 48 year old female here for evaluation of syncope, hypertension, left-sided numbness.  Patient was at work when all of a sudden she had left arm numbness and weakness, lightheadedness.  She ended up briefly passing out.  Once she came to the left arm symptoms had resolved.  She was taken to the emergency room and systolic blood pressure was 240.  CT head, MRI brain were unremarkable.  Patient has remote history of migraine with aura, with right greater than left-sided headaches, throbbing sensation, sensitivity to light and nausea.  These started around age 70 years old.  She has been on Imitrex in the past which caused nausea.      REVIEW OF SYSTEMS: Full 14 system review of systems performed and negative with exception of: as per HPI.  ALLERGIES: Allergies  Allergen Reactions   Aspirin Other (See Comments)    Upset Stomach    Pineapple Other (See Comments)    Mouth swells, occurred only after pregnancy   Amlodipine Swelling    Issues with swelling even at 5mg  dose   Hctz [Hydrochlorothiazide] Swelling and Other (See Comments)    Causes swelling and headaches    Lisinopril Cough   Doxycycline Rash    HOME MEDICATIONS: Outpatient Medications Prior to Visit  Medication Sig Dispense Refill   aspirin EC 81 MG tablet Take 1 tablet (81 mg total) by mouth daily. Swallow whole.     atorvastatin (LIPITOR) 20 MG tablet Take 1 tablet (20 mg total) by mouth daily. 30  tablet 3   carvedilol (COREG) 6.25 MG tablet Take 1 tablet (6.25 mg total) by mouth 2 (two) times daily with a meal. 180 tablet 1   omeprazole (PRILOSEC) 20 MG capsule Take 1 capsule (20 mg total) by mouth daily. 30 capsule 2   Rimegepant Sulfate (NURTEC) 75 MG TBDP Take 1 tablet (75 mg total) by mouth daily as needed. (Patient not taking: Reported on 08/20/2023) 8 tablet 6   sertraline (ZOLOFT) 50 MG tablet Take 1 tablet (50 mg total) by mouth daily. 90 tablet 0   spironolactone (ALDACTONE) 50 MG tablet Take 1 tablet (50 mg total) by mouth daily. 90 tablet 0   topiramate (TOPAMAX) 50 MG tablet Take 1 tablet (50 mg total) by mouth 2 (two) times daily. 60 tablet 12   verapamil (CALAN-SR) 240 MG CR tablet Take 1 tablet (240 mg total) by mouth at bedtime. 90 tablet 0   No facility-administered medications prior to visit.    PAST MEDICAL HISTORY: Past Medical History:  Diagnosis Date   Acid reflux 2005   Environmental allergies 2019   Hypertension    Irregular heart beat 2019   Migraine 2019   Sleep apnea 2019    PAST SURGICAL HISTORY: Past Surgical History:  Procedure Laterality Date   THYROID SURGERY  2012   TUBAL LIGATION  2009    FAMILY HISTORY: Family History  Problem Relation Age of Onset  Diabetes Father    Hypertension Father    Heart failure Father    Alcohol abuse Father    Drug abuse Father    Depression Father    Hyperlipidemia Father    Sickle cell anemia Sister     SOCIAL HISTORY: Social History   Socioeconomic History   Marital status: Divorced    Spouse name: Not on file   Number of children: Not on file   Years of education: Not on file   Highest education level: Not on file  Occupational History   Not on file  Tobacco Use   Smoking status: Every Day    Types: Cigarettes    Start date: 6   Smokeless tobacco: Never  Substance and Sexual Activity   Alcohol use: Yes    Comment: occasional wine   Drug use: No   Sexual activity: Not on file   Other Topics Concern   Not on file  Social History Narrative   Not on file   Social Drivers of Health   Financial Resource Strain: Not on file  Food Insecurity: Not on file  Transportation Needs: Not on file  Physical Activity: Not on file  Stress: Not on file  Social Connections: Not on file  Intimate Partner Violence: Not on file     PHYSICAL EXAM  GENERAL EXAM/CONSTITUTIONAL: Vitals:  There were no vitals filed for this visit.  There is no height or weight on file to calculate BMI. Wt Readings from Last 3 Encounters:  08/20/23 193 lb (87.5 kg)  07/19/23 190 lb (86.2 kg)  05/13/23 190 lb 12.8 oz (86.5 kg)   Patient is in no distress; well developed, nourished and groomed; neck is supple  CARDIOVASCULAR: Examination of carotid arteries is normal; no carotid bruits Regular rate and rhythm, no murmurs Examination of peripheral vascular system by observation and palpation is normal  EYES: Ophthalmoscopic exam of optic discs and posterior segments is normal; no papilledema or hemorrhages No results found.  MUSCULOSKELETAL: Gait, strength, tone, movements noted in Neurologic exam below  NEUROLOGIC: MENTAL STATUS:      No data to display         awake, alert, oriented to person, place and time recent and remote memory intact normal attention and concentration language fluent, comprehension intact, naming intact fund of knowledge appropriate  CRANIAL NERVE:  2nd - no papilledema on fundoscopic exam 2nd, 3rd, 4th, 6th - pupils equal and reactive to light, visual fields full to confrontation, extraocular muscles intact, no nystagmus 5th - facial sensation symmetric 7th - facial strength symmetric 8th - hearing intact 9th - palate elevates symmetrically, uvula midline 11th - shoulder shrug symmetric 12th - tongue protrusion midline  MOTOR:  normal bulk and tone, full strength in the BUE, BLE  SENSORY:  normal and symmetric to light touch, temperature,  vibration  COORDINATION:  finger-nose-finger, fine finger movements normal  REFLEXES:  deep tendon reflexes present and symmetric  GAIT/STATION:  narrow based gait     DIAGNOSTIC DATA (LABS, IMAGING, TESTING) - I reviewed patient records, labs, notes, testing and imaging myself where available.  Lab Results  Component Value Date   WBC 9.0 05/01/2023   HGB 12.9 05/01/2023   HCT 38.0 05/01/2023   MCV 96.4 05/01/2023   PLT 373 05/01/2023      Component Value Date/Time   NA 138 05/01/2023 1036   NA 141 12/14/2022 1220   K 4.6 05/01/2023 1036   CL 105 05/01/2023 1036   CO2 21 (  L) 05/01/2023 1034   GLUCOSE 224 (H) 05/01/2023 1036   BUN 16 05/01/2023 1036   BUN 15 12/14/2022 1220   CREATININE 1.00 05/01/2023 1036   CALCIUM 9.2 05/01/2023 1034   PROT 7.1 05/01/2023 1034   PROT 6.8 08/03/2020 1121   ALBUMIN 3.4 (L) 05/01/2023 1034   ALBUMIN 4.2 08/03/2020 1121   AST 29 05/01/2023 1034   ALT 24 05/01/2023 1034   ALKPHOS 64 05/01/2023 1034   BILITOT 0.6 05/01/2023 1034   BILITOT 0.3 08/03/2020 1121   GFRNONAA >60 05/01/2023 1034   GFRAA 89 08/03/2020 1121   Lab Results  Component Value Date   CHOL 258 (H) 01/19/2022   HDL 66 01/19/2022   LDLCALC 175 (H) 01/19/2022   TRIG 96 01/19/2022   CHOLHDL 3.9 01/19/2022   Lab Results  Component Value Date   HGBA1C 5.5 05/13/2023   Lab Results  Component Value Date   VITAMINB12 393 04/11/2018   Lab Results  Component Value Date   TSH 1.430 08/03/2020    05/01/23 CT head  1. No acute intracranial hemorrhage or acute territorial infarct. 2. Small infarct in the anterior limb of the right internal capsule is age-indeterminate and new since 2021, but is favored remote. An additional small infarct in the right lentiform nucleus is unchanged since 2021.  05/01/23 MRI brain  No acute finding. Chronic small-vessel ischemic changes of the pons and cerebral hemispheric white matter. 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  08/01/23 CTA head / neck 1. No acute head CT finding. Small-vessel changes shown by MRI are not well seen by CT. 2. No large vessel occlusion or flow limiting proximal stenosis. 3. Atherosclerotic calcification in both carotid siphon regions with stenosis of 30% on both sides. 4. Minimal soft plaque at both carotid bifurcations but no stenosis. 5. Apparent right thyroidectomy. Mildly enlarged and heterogeneous left lobe of the thyroid without definable focal nodule. I presume this has been evaluated elsewhere. If not, thyroid ultrasound would be recommended for better characterization.  08/13/23 TTE - Left ventricular ejection fraction, by estimation, is 55 to 60%.  - Agitated saline contrast bubble study was negative, with no evidence  of any interatrial shunt.     ASSESSMENT AND PLAN  48 y.o. year old female here with:   Dx:  1. TIA (transient ischemic attack)   2. Migraine without aura and without status migrainosus, not intractable       PLAN:  SYNCOPE / HYPERTENSIVE EMERGENCY / LEFT ARM NUMBNESS (05/01/23)  - patient tells me that she actually passed out; different that ER and PCP notes; this could have been hypertensive emergency event with TIA symptoms  - recommend cardiology evaluation (for syncope event)  - continue aspirin 81mg  daily  - continue BP control  - consider increase atorvastatin to 80mg  daily and follow up with PCP (last LDL 175)  - According to [place] law, you can not drive unless you are syncope free for at least 6 months and under physician's care, for unprovoked syncope events. May discuss with cardiology and PCP to see if this can be reduced to 3 months after additional testing and treatment. Unclear if this was provoked by hypertensive emergency and energy drink and stress, or if it was unprovoked.  - Please maintain precautions. Do not participate in activities where a loss of awareness could harm you or someone else. No swimming alone,  no tub bathing, no hot tubs, no driving, no operating motorized vehicles (cars, ATVs, motocycles,  etc), lawnmowers, power tools or firearms. No standing at heights, such as rooftops, ladders or stairs. Avoid hot objects such as stoves, heaters, open fires. Wear a helmet when riding a bicycle, scooter, skateboard, etc. and avoid areas of traffic. Set your water heater to 120 degrees or less.    MIGRAINE WITH AURA  - has tried ibuprofen, tylenol, sumatriptan in past  - stop triptans (due to HTN, stroke risk)  - continue coreg and verapamil  - continue topiramate 50mg  twice a day for migraine prevention  - continue nurtec 75mg  as needed for migraine rescue  Return in about 1 year (around 10/20/2024) for MyChart visit (15 min), with NP. For refills  Virtual Visit via Video Note  I connected with Ollen Gross on 10/21/23 at  2:45 PM EST by a video enabled telemedicine application and verified that I am speaking with the correct person using two identifiers.   I discussed the limitations of evaluation and management by telemedicine and the availability of in person appointments. The patient expressed understanding and agreed to proceed.  Patient is at home and I am at the office.   I spent 15 minutes of face-to-face and non-face-to-face time with patient.  This included previsit chart review, lab review, study review, order entry, electronic health record documentation, patient education.     Suanne Marker, MD 10/21/2023, 3:31 PM Certified in Neurology, Neurophysiology and Neuroimaging  [facility] [address] [place], [place] [address] [phone]

## 2023-10-29 ENCOUNTER — Ambulatory Visit (INDEPENDENT_AMBULATORY_CARE_PROVIDER_SITE_OTHER): Payer: Managed Care, Other (non HMO) | Admitting: Family Medicine

## 2023-10-29 ENCOUNTER — Encounter: Payer: Self-pay | Admitting: Family Medicine

## 2023-10-29 VITALS — BP 145/103 | HR 97 | Temp 98.8°F | Ht 63.0 in | Wt 189.2 lb

## 2023-10-29 DIAGNOSIS — I10 Essential (primary) hypertension: Secondary | ICD-10-CM

## 2023-10-29 DIAGNOSIS — R21 Rash and other nonspecific skin eruption: Secondary | ICD-10-CM | POA: Diagnosis not present

## 2023-10-29 DIAGNOSIS — J029 Acute pharyngitis, unspecified: Secondary | ICD-10-CM

## 2023-10-29 DIAGNOSIS — L732 Hidradenitis suppurativa: Secondary | ICD-10-CM | POA: Diagnosis not present

## 2023-10-29 LAB — POC SOFIA 2 FLU + SARS ANTIGEN FIA
Influenza A, POC: NEGATIVE
Influenza B, POC: NEGATIVE
SARS Coronavirus 2 Ag: NEGATIVE

## 2023-10-29 LAB — POCT RAPID STREP A (OFFICE): Rapid Strep A Screen: NEGATIVE

## 2023-10-29 MED ORDER — CLINDAMYCIN PHOSPHATE 1 % EX GEL: Freq: Two times a day (BID) | CUTANEOUS | 0 refills | Status: AC

## 2023-10-29 MED ORDER — HYDROCORTISONE 0.5 % EX CREA: 1.0000 | TOPICAL_CREAM | Freq: Two times a day (BID) | CUTANEOUS | 0 refills | Status: AC

## 2023-10-29 NOTE — Progress Notes (Signed)
    SUBJECTIVE:   CHIEF COMPLAINT / HPI:   Sore Throat  This is a new problem. Episode onset: 4 days. The problem has been gradually worsening. There has been no fever. The pain is at a severity of 8/10. Associated symptoms include a hoarse voice, shortness of breath and trouble swallowing. Pertinent negatives include no congestion or vomiting. Treatments tried: hot liquids, cough drops, throat spray.    Has not received COVID and Flu vaccine.   Skin Irritation Has been experiencing irritation and bumps along the distribution of where her CPAP machine is placed on her face.   Hidraadenitis Suppurativa Patient endorses recent flare under her arms and seeks treatment for this.    PERTINENT  PMH / PSH: OSA  OBJECTIVE:   BP (!) 147/80   Pulse 97   Temp 98.8 F (37.1 C) (Oral)   Ht 5' 3 (1.6 m)   Wt 189 lb 4 oz (85.8 kg)   LMP 10/22/2023   SpO2 100%   BMI 33.52 kg/m   Physical Exam Constitutional:      General: She is not in acute distress.    Appearance: She is not toxic-appearing.  HENT:     Right Ear: Tympanic membrane normal.     Left Ear: Tympanic membrane normal.     Nose: No congestion or rhinorrhea.     Mouth/Throat:     Mouth: Mucous membranes are moist.     Pharynx: No oropharyngeal exudate or posterior oropharyngeal erythema.  Cardiovascular:     Rate and Rhythm: Normal rate and regular rhythm.  Pulmonary:     Effort: Pulmonary effort is normal.     Breath sounds: Normal breath sounds.  Skin:    Comments: Small, raised, scattered perioral and peri-nasal papular lesions. Mild pitting and discoloration under both axilla. No erythema or tracks on her left axilla. Right axilla looks good with old scars  Neurological:     Mental Status: She is alert.      ASSESSMENT/PLAN:   No problem-specific Assessment & Plan notes found for this encounter.  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    Pharyngitis  Ddx  includes COVID,Flu, Strep, and viral infection. On assessment, she is not in acute distress, and given her mild symptom burden and lack of fever, this is most likely a viral etiology. Plan: Use Tylenol  for pain relief as well as warm saline gurgle. If pain persists beyond 10 days or worsens, can consider a bacterial cause and re-evaluate if she may benefit from antibiotics.   Neg rapid strep, influenza and COVID-19 tests  Contact Dermatitis Ddx for facial lesions includes contact dermatitis vs sebhorheic dermatitis vs HSV. Most likely diagnosis is contact dermatitis given the focal distribution of the lesion coinciding with her CPAP placement. Sebhorheic dermatitis less likely given the absence of crusting or flaking. HSV unlikely due to no PMHx of infection, no signs of ulceration, and nontender presentation. Plan: Treat with hydrocortisone  cream sparingly.   Hidraadenitis Suppurativa  Patient endorses recent flare under both axilla. On assessment, she is Hurley Stage 2. Plan: Treat with topical clindamycin  for 14 days.   FOLLOW UP: BP recheck on 12/10/23 at 10 AM with PCP.   Anne Gonzalez, Medical Student [facility]

## 2023-10-29 NOTE — Patient Instructions (Addendum)
 It was nice seeing you. Your strep test is negative Your COVID and flu tests are negative You might be having viral infection causing your symptoms. Let us  trial Tylenol  as needed for pain with warm saline gurgle See your PCP soon if there is no improvement.  I sent in some cream for your Hidradenitis Suppurativa and your facial lesion which might be due to contact irritation from your CPAP mask. See PCP soon if there is no improvement.  We have scheduled an appointment for your with Dr. McDiarmid on 12/10/23 at 10 am. Call soon if you have any questions.

## 2023-11-12 ENCOUNTER — Encounter: Payer: Self-pay | Admitting: Family Medicine

## 2023-11-12 ENCOUNTER — Telehealth: Payer: Self-pay

## 2023-11-12 ENCOUNTER — Ambulatory Visit (INDEPENDENT_AMBULATORY_CARE_PROVIDER_SITE_OTHER): Payer: Managed Care, Other (non HMO) | Admitting: Family Medicine

## 2023-11-12 VITALS — BP 148/93 | HR 121 | Temp 102.0°F | Ht 63.0 in | Wt 185.1 lb

## 2023-11-12 DIAGNOSIS — J028 Acute pharyngitis due to other specified organisms: Secondary | ICD-10-CM | POA: Diagnosis not present

## 2023-11-12 LAB — POCT RAPID STREP A (OFFICE): Rapid Strep A Screen: NEGATIVE

## 2023-11-12 MED ORDER — ACETAMINOPHEN-CODEINE 300-30 MG PO TABS: 1.0000 | ORAL_TABLET | Freq: Four times a day (QID) | ORAL | 0 refills | Status: DC | PRN

## 2023-11-12 MED ORDER — PHENOL 0.5 % MT SOLN: 1.0000 | OROMUCOSAL | 0 refills | Status: AC | PRN

## 2023-11-12 NOTE — Progress Notes (Signed)
    SUBJECTIVE:   CHIEF COMPLAINT / HPI:   Seen for pharyngitis on 10/29/23.  Tested negative for flu and covid then. Tested positive for Covid today. Sore throat has continued Smoking for 35 years, half a pack per day. Sore throat did not resolve. Now is worse. Cold and hot beverages provide some relief. Now has congestion and cough. Started yesterday. X1 emesis NBNB yesterday after coughing. No diarrhea. Eating less. Drinking normal amount of water and juice. Ginger tea has helped. Tylenol helps a little bit, x2 650s 2-3 times per day.   PERTINENT  PMH / PSH: HTN, OSA, Hidrenitis, Tobacco use, HLD  OBJECTIVE:   BP (!) 148/93   Pulse (!) 121   Temp (!) 102 F (38.9 C) (Oral)   Ht 5\' 3"  (1.6 m)   Wt 185 lb 2 oz (84 kg)   LMP 10/22/2023   BMI 32.79 kg/m   General: NAD, sick appearing, non toxic Neuro: A&O HEENT: MMM, posterior oropharyngeal erythema, nasal congestion present Cardiovascular: RRR, no murmurs, no peripheral edema Respiratory: normal WOB on RA, CTAB, no wheezes, ronchi or rales Abdomen: soft, NTTP, no rebound or guarding Extremities: Moving all 4 extremities equally   ASSESSMENT/PLAN:   Assessment & Plan Acute pharyngitis due to other specified organisms Due to COVID. Suspect this a is secondary viral infection with COVID given positive test, not an acute worsening in symptoms indicating new bacterial infection. Tachycardia present in the setting of febrile to 102. Low suspicion for pneumonia. Continue supportive care. -Phenol mouth spray -Tylenol3 q6h prn -Ibuprofen 600mg  q8h prn -Continue soothing foods/drinks -Counseled on return precautions for hydration and difficulty breathing -If sore throat persists past next 2 weeks would follow-up with RPR, HIV, CMV, and ENT referral for laryngoscopy -Unlikely strep given age previous test, patient requesting throat culture  Return if symptoms worsen or fail to improve.  Celine Mans, MD [facility]  [facility]

## 2023-11-12 NOTE — Patient Instructions (Addendum)
It was great to see you! Thank you for allowing me to participate in your care!  Our plans for today:  - You have a COVID causing your symptoms. - This will get better over the next week. - I have sent Tylenol3 to your pharmacy, you may take this up to 4 time per day. - You may take Ibuprofen 600mg  three times a day for 1 week. - Over the counter allergy medicine such as Claritin and Flonase may help with your congestion. - Cough drops may help with your throat and cough. - If you do not get better in the next 5 days please return to care. - It is important to stay hydrated, and continue eating while sick. - If this is not better in 2 weeks please return to care.    Please arrive 15 minutes PRIOR to your next scheduled appointment time! If you do not, this affects OTHER patients' care.  Take care and seek immediate care sooner if you develop any concerns.   Celine Mans, MD, PGY-2 [facility] 2:01 PM 11/12/2023  [facility]

## 2023-11-12 NOTE — Telephone Encounter (Signed)
Patient calls nurse line regarding continued sick symptoms.  She was seen on 2/4 for sore throat and rash. She reports that rash has went away, however, sore throat has been persistent. Negative COVID and flu on 2/4. She states that she took a home COVID test today that was positive.   She also reports chills, body aches, cough (started about one week ago), congestion.   Unknown fever as thermometer is not working. Denies difficulty with breathing.   She reports trying multiple OTC medications(Tylenol, alka seltzer, ginger tea). She states that symptoms have worsened.    Patient was told that she would need to schedule follow up if symptoms did not improve. Scheduled for follow up this afternoon.   Veronda Prude, RN

## 2023-11-14 LAB — CULTURE, GROUP A STREP: Strep A Culture: NEGATIVE

## 2023-12-10 ENCOUNTER — Ambulatory Visit (INDEPENDENT_AMBULATORY_CARE_PROVIDER_SITE_OTHER): Payer: Managed Care, Other (non HMO) | Admitting: Family Medicine

## 2023-12-10 ENCOUNTER — Encounter: Payer: Self-pay | Admitting: Family Medicine

## 2023-12-10 VITALS — BP 153/111 | HR 99 | Ht 63.0 in | Wt 184.2 lb

## 2023-12-10 DIAGNOSIS — I1A0 Resistant hypertension: Secondary | ICD-10-CM

## 2023-12-10 DIAGNOSIS — E782 Mixed hyperlipidemia: Secondary | ICD-10-CM

## 2023-12-10 DIAGNOSIS — Z1159 Encounter for screening for other viral diseases: Secondary | ICD-10-CM

## 2023-12-10 DIAGNOSIS — L732 Hidradenitis suppurativa: Secondary | ICD-10-CM

## 2023-12-10 DIAGNOSIS — Z79899 Other long term (current) drug therapy: Secondary | ICD-10-CM

## 2023-12-10 DIAGNOSIS — E049 Nontoxic goiter, unspecified: Secondary | ICD-10-CM

## 2023-12-10 DIAGNOSIS — E890 Postprocedural hypothyroidism: Secondary | ICD-10-CM | POA: Diagnosis not present

## 2023-12-10 DIAGNOSIS — Z8673 Personal history of transient ischemic attack (TIA), and cerebral infarction without residual deficits: Secondary | ICD-10-CM

## 2023-12-10 MED ORDER — SULFAMETHOXAZOLE-TRIMETHOPRIM 800-160 MG PO TABS: 1.0000 | ORAL_TABLET | Freq: Two times a day (BID) | ORAL | 3 refills | Status: DC

## 2023-12-10 NOTE — Patient Instructions (Addendum)
 It sounds like your home blood pressures are good.  Please keep taking your medis like you are.    Septra DS with refills for your HS  Return for lipids tests at our lab at your [facility].

## 2023-12-10 NOTE — Progress Notes (Unsigned)
 Anne Gonzalez is {Pc accompanied by:5710} Sources of clinical information for visit is/are {Information source:60032}. Nursing assessment for this office visit was reviewed with the patient for accuracy and revision.     Previous Report(s) Reviewed: {Outside review:15817}     12/10/2023    9:59 AM  Depression screen PHQ 2/9  Decreased Interest 0  Down, Depressed, Hopeless 0  PHQ - 2 Score 0  Altered sleeping 0  Tired, decreased energy 0  Change in appetite 0  Feeling bad or failure about yourself  0  Trouble concentrating 0  Moving slowly or fidgety/restless 0  Suicidal thoughts 0  PHQ-9 Score 0   Flowsheet Row Office Visit from 12/10/2023 in [facility] Of [place]. [facility] Office Visit from 11/12/2023 in [facility] Of [facility]. [facility] Office Visit from 10/29/2023 in [facility] - [facility] Dept Of [facility]. [facility]  Thoughts that you would be better off dead, or of hurting yourself in some way Not at all Not at all Not at all  PHQ-9 Total Score 0 0 21          11/19/2022    4:18 PM 08/03/2020   10:19 AM 11/26/2019    1:33 PM 05/06/2018    3:18 PM 04/18/2018    3:36 PM  Fall Risk   Falls in the past year? 0 0 0 No No  Number falls in past yr: 0 0 0    Injury with Fall? 0 0 0         12/10/2023    9:59 AM 11/12/2023    2:02 PM 10/29/2023    9:53 AM  PHQ9 SCORE ONLY  PHQ-9 Total Score 0 0 21    There are no preventive care reminders to display for this patient.  Health Maintenance Due  Topic Date Due   Pneumococcal Vaccine 57-25 Years old (1 of 2 - PCV) Never done   Hepatitis C Screening  Never done   DTaP/Tdap/Td (1 - Tdap) Never done   Colonoscopy  Never done   Cervical Cancer Screening (HPV/Pap Cotest)  10/17/2023      History/P.E. limitations: {exam; limitations ed:60112}  There are no preventive care reminders to display for this patient. There are no  preventive care reminders to display for this patient.  Health Maintenance Due  Topic Date Due   Pneumococcal Vaccine 10-84 Years old (1 of 2 - PCV) Never done   Hepatitis C Screening  Never done   DTaP/Tdap/Td (1 - Tdap) Never done   Colonoscopy  Never done   Cervical Cancer Screening (HPV/Pap Cotest)  10/17/2023     Chief Complaint  Patient presents with   Medical Management of Chronic Issues    BP     --------------------------------------------------------------------------------------------------------------------------------------------- Visit Problem List with A/P  No problem-specific Assessment & Plan notes found for this encounter.

## 2023-12-11 ENCOUNTER — Encounter: Payer: Self-pay | Admitting: Family Medicine

## 2023-12-11 DIAGNOSIS — E049 Nontoxic goiter, unspecified: Secondary | ICD-10-CM | POA: Insufficient documentation

## 2023-12-11 NOTE — Assessment & Plan Note (Addendum)
 Plan to check Lipid panel given LDL 175 at last check Anne Gonzalez is not taking atorvastatin She has a history of TIA in her PMH

## 2023-12-11 NOTE — Assessment & Plan Note (Signed)
 Established problem Recurrent, episodic flares  Prescription Septra DS twice a day x 10d with refills for self initiation Ms Repsher is already on Sprionlactone as antihypertensive.  It also will act to prevent flares.   Additional possible therapies: metformin, suppressive dosing of doxycycline, isotretinoin, and monoclonal therapy.  Surgery

## 2023-12-11 NOTE — Assessment & Plan Note (Signed)
 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  (+) tolerating her medications.  Recommend continuation of above antihypertensive medicaions.

## 2023-12-26 ENCOUNTER — Ambulatory Visit: Payer: Managed Care, Other (non HMO) | Admitting: Adult Health

## 2023-12-31 ENCOUNTER — Encounter: Payer: Self-pay | Admitting: Anesthesiology

## 2024-01-02 ENCOUNTER — Ambulatory Visit: Admitting: Neurology

## 2024-01-02 ENCOUNTER — Telehealth: Payer: Self-pay

## 2024-01-02 ENCOUNTER — Encounter: Payer: Self-pay | Admitting: Neurology

## 2024-01-02 VITALS — BP 157/107 | HR 74 | Resp 15 | Wt 184.3 lb

## 2024-01-02 DIAGNOSIS — G4733 Obstructive sleep apnea (adult) (pediatric): Secondary | ICD-10-CM | POA: Diagnosis not present

## 2024-01-02 NOTE — Patient Instructions (Signed)
 I will lower your CPAP pressure from 13 to 12 to see if more tolerable. Recommend nightly use minimum 4 hours. Let me know how the pressure change does.

## 2024-01-02 NOTE — Telephone Encounter (Signed)
Community message sent via epic for CPAP orders

## 2024-01-02 NOTE — Progress Notes (Signed)
 Patient: Anne Gonzalez Date of Birth: 04-01-1976  Reason for Visit: Follow up for OSA History from: Patient Primary Neurologist: Frances Furbish   ASSESSMENT AND PLAN 48 y.o. year old female   1.  Severe OSA on CPAP (HST 09/30/23 showed severe OSA with total AHI of 54.8/hour and O2 nadir of 71%.Setup date 10/15/23)  -Has been struggling to use CPAP since she developed a sore throat for several weeks, COVID infection back in February.  She originally felt the sore throat was due to CPAP.  She could not tolerate full facemask due to facial rash.  She now has a nasal pillow mask.  She has increased the humidity.  She is applying Vaseline to her nostrils.  I will lower the set pressure from 7-13 cm to 7-12 cm to see if more tolerable to not cause sore throat.  I have asked her to follow-up with me in the next 1 to 2 weeks to see if the changes more tolerable. Question if she may be mouth breathing, could consider adding chin strap. I have sent myself a reminder to reach out to her in 1 month to pull a CPAP report. Otherwise 6 months CPAP follow up.   HISTORY OF PRESENT ILLNESS: Today 01/02/24 Saw Dr. Frances Furbish November 2024 for for sleep consult with prior diagnoses of OSA with CPAP use. HST 09/30/23 showed severe OSA with total AHI of 54.8/hour and O2 nadir of 71%.Setup date 10/15/23. Data below in initial 30 days is below with better compliance 78%,  > 4 hours 63%, AHI 0.7, leak 8.3. More recent data of the last 30 days show poor compliance at 13%.  Unfortunately, she developed a sore throat 10/29/23 continued for several weeks.  She then tested positive for COVID.  Since then she has not used CPAP.  Has been fearful it would result in a sore throat.  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  She was given Tylenol 3 for the sore throat.  She is motivated to resume CPAP.  She initially had a fullface mask (caused facial rash) , then went back to her DME and now has a nasal pillow mask.  She has increased the  humidity.ESS 13. She is feeling tired, not sleeping well, motivated to use CPAP.         HISTORY  08/20/23 Dr. Frances Furbish: I saw your patient, Jacelynn Hayton, upon your kind request in my sleep clinic today for initial consultation of her sleep disorder, in particular, evaluation of her prior diagnosis of obstructive sleep apnea.  The patient is unaccompanied today.  As you know, Ms. Lyles is a 48 year old female with an underlying medical history of TIA, hypertensive emergency, smoking, reflux disease, allergies, migraine headaches, status post partial thyroidectomy, and obesity, who was previously diagnosed with obstructive sleep apnea and placed on PAP therapy.  Her Epworth sleepiness score is 14 out of 24, fatigue severity score is 23 out of 63.  I reviewed your office note from 07/19/2023.  [place] when she was still residing in [place].  She recalls that she had mild sleep apnea at the time but does report that her symptoms have become worse.  She reports loud snoring and daytime somnolence as well as witnessed apneas and significant weight gain within the past 7 to 8 months in the realm of 20 pounds.  She lives with her 2 younger children who are teenagers.  She has 2 grown children as well.  She works as an Charity fundraiser in a Paediatric nurse.  She works from Monday through Friday, 8-4.  Bedtime is variable but generally around 10:30 PM and rise time around 6:30 AM.  She has nocturia about 3 times per average night and has had occasional morning headaches.  She limits her caffeine to 1 to 2 cups of soda per day.  She drinks alcohol occasionally.  She is trying to quit smoking but still smokes about half a pack per day or less.  Prior sleep study results are not available for my review today.  She no longer has a CPAP machine and has not used a PAP machine in years.  She would be willing to get retested an restart PAP therapy.   REVIEW OF SYSTEMS: Out of a  complete 14 system review of symptoms, the patient complains only of the following symptoms, and all other reviewed systems are negative.  See HPI  ALLERGIES: Allergies  Allergen Reactions   Aspirin Other (See Comments)    Upset Stomach    Pineapple Other (See Comments)    Mouth swells, occurred only after pregnancy   Amlodipine Swelling    Issues with swelling even at 5mg  dose   Hctz [Hydrochlorothiazide] Swelling and Other (See Comments)    Causes swelling and headaches    Lisinopril Cough   Doxycycline Rash    HOME MEDICATIONS: Outpatient Medications Prior to Visit  Medication Sig Dispense Refill   aspirin EC 81 MG tablet Take 1 tablet (81 mg total) by mouth daily. Swallow whole.     carvedilol (COREG) 6.25 MG tablet Take 1 tablet (6.25 mg total) by mouth 2 (two) times daily with a meal. 180 tablet 1   omeprazole (PRILOSEC) 20 MG capsule Take 1 capsule (20 mg total) by mouth daily. 30 capsule 2   Rimegepant Sulfate (NURTEC) 75 MG TBDP Take 1 tablet (75 mg total) by mouth daily as needed. 8 tablet 6   sertraline (ZOLOFT) 50 MG tablet Take 1 tablet (50 mg total) by mouth daily. 90 tablet 0   spironolactone (ALDACTONE) 50 MG tablet Take 1 tablet (50 mg total) by mouth daily. 90 tablet 0   sulfamethoxazole-trimethoprim (BACTRIM DS) 800-160 MG tablet Take 1 tablet by mouth 2 (two) times daily. 20 tablet 3   topiramate (TOPAMAX) 50 MG tablet Take 1 tablet (50 mg total) by mouth 2 (two) times daily. 60 tablet 12   verapamil (CALAN-SR) 240 MG CR tablet Take 1 tablet (240 mg total) by mouth at bedtime. 90 tablet 0   acetaminophen-codeine (TYLENOL #3) 300-30 MG tablet Take 1 tablet by mouth every 6 (six) hours as needed for moderate pain (pain score 4-6). 30 tablet 0   No facility-administered medications prior to visit.    PAST MEDICAL HISTORY: Past Medical History:  Diagnosis Date   Abnormal menstruation 08/04/2020   Acid reflux 2005   Environmental allergies 2019   Hypertension     Irregular heart beat 2019   Lumbar pain with radiation down left leg 04/13/2021   Migraine 2019   Sleep apnea 2019    PAST SURGICAL HISTORY: Past Surgical History:  Procedure Laterality Date   THYROID SURGERY  2012   TUBAL LIGATION  2009    FAMILY HISTORY: Family History  Problem Relation Age of Onset   Diabetes Father    Hypertension Father    Heart failure Father    Alcohol abuse Father    Drug abuse Father    Depression Father    Hyperlipidemia  Father    Sickle cell anemia Sister     SOCIAL HISTORY: Social History   Socioeconomic History   Marital status: Divorced    Spouse name: Not on file   Number of children: Not on file   Years of education: Not on file   Highest education level: Not on file  Occupational History   Not on file  Tobacco Use   Smoking status: Every Day    Types: Cigarettes    Start date: 51   Smokeless tobacco: Never  Substance and Sexual Activity   Alcohol use: Yes    Comment: occasional wine   Drug use: No   Sexual activity: Not on file  Other Topics Concern   Not on file  Social History Narrative   Not on file   Social Drivers of Health   Financial Resource Strain: Not on file  Food Insecurity: Not on file  Transportation Needs: Not on file  Physical Activity: Not on file  Stress: Not on file  Social Connections: Not on file  Intimate Partner Violence: Not on file    PHYSICAL EXAM  Vitals:   01/02/24 0750  BP: (!) 157/107  Pulse: 74  Resp: 15  Weight: 184 lb 4.9 oz (83.6 kg)   Body mass index is 32.65 kg/m.  Generalized: Well developed, in no acute distress  Neurological examination  Mentation: Alert oriented to time, place, history taking. Follows all commands speech and language fluent Cranial nerve II-XII: Pupils were equal round reactive to light. Extraocular movements were full, visual field were full on confrontational test. Facial sensation and strength were normal. Head turning and shoulder shrug   were normal and symmetric. Motor: The motor testing reveals 5 over 5 strength of all 4 extremities. Good symmetric motor tone is noted throughout.  Sensory: Sensory testing is intact to soft touch on all 4 extremities. No evidence of extinction is noted.  Coordination: Cerebellar testing reveals good finger-nose-finger  Gait and station: Gait is normal.    DIAGNOSTIC DATA (LABS, IMAGING, TESTING) - I reviewed patient records, labs, notes, testing and imaging myself where available.  Lab Results  Component Value Date   WBC 9.0 05/01/2023   HGB 12.9 05/01/2023   HCT 38.0 05/01/2023   MCV 96.4 05/01/2023   PLT 373 05/01/2023      Component Value Date/Time   NA 138 05/01/2023 1036   NA 141 12/14/2022 1220   K 4.6 05/01/2023 1036   CL 105 05/01/2023 1036   CO2 21 (L) 05/01/2023 1034   GLUCOSE 224 (H) 05/01/2023 1036   BUN 16 05/01/2023 1036   BUN 15 12/14/2022 1220   CREATININE 1.00 05/01/2023 1036   CALCIUM 9.2 05/01/2023 1034   PROT 7.1 05/01/2023 1034   PROT 6.8 08/03/2020 1121   ALBUMIN 3.4 (L) 05/01/2023 1034   ALBUMIN 4.2 08/03/2020 1121   AST 29 05/01/2023 1034   ALT 24 05/01/2023 1034   ALKPHOS 64 05/01/2023 1034   BILITOT 0.6 05/01/2023 1034   BILITOT 0.3 08/03/2020 1121   GFRNONAA >60 05/01/2023 1034   GFRAA 89 08/03/2020 1121   Lab Results  Component Value Date   CHOL 258 (H) 01/19/2022   HDL 66 01/19/2022   LDLCALC 175 (H) 01/19/2022   TRIG 96 01/19/2022   CHOLHDL 3.9 01/19/2022   Lab Results  Component Value Date   HGBA1C 5.5 05/13/2023   Lab Results  Component Value Date   VITAMINB12 393 04/11/2018   Lab Results  Component Value Date  TSH 1.430 08/03/2020    Margie Ege, AGNP-C, DNP 01/02/2024, 7:52 AM [facility] [address] [place], [place] [address] [phone]

## 2024-01-30 ENCOUNTER — Encounter: Payer: Self-pay | Admitting: Student

## 2024-01-30 ENCOUNTER — Ambulatory Visit (INDEPENDENT_AMBULATORY_CARE_PROVIDER_SITE_OTHER): Admitting: Student

## 2024-01-30 ENCOUNTER — Other Ambulatory Visit (HOSPITAL_COMMUNITY)
Admission: RE | Admit: 2024-01-30 | Discharge: 2024-01-30 | Disposition: A | Discharge status: Home / Self Care | Source: Ambulatory Visit | Attending: Family Medicine | Admitting: Family Medicine

## 2024-01-30 VITALS — BP 178/98 | HR 93 | Wt 185.0 lb

## 2024-01-30 DIAGNOSIS — Z113 Encounter for screening for infections with a predominantly sexual mode of transmission: Secondary | ICD-10-CM

## 2024-01-30 DIAGNOSIS — Z124 Encounter for screening for malignant neoplasm of cervix: Secondary | ICD-10-CM

## 2024-01-30 DIAGNOSIS — R21 Rash and other nonspecific skin eruption: Secondary | ICD-10-CM | POA: Diagnosis not present

## 2024-01-30 DIAGNOSIS — I10 Essential (primary) hypertension: Secondary | ICD-10-CM | POA: Diagnosis not present

## 2024-01-30 MED ORDER — PENICILLIN G BENZATHINE 1200000 UNIT/2ML IM SUSY
2.4000 10*6.[IU] | PREFILLED_SYRINGE | Freq: Once | INTRAMUSCULAR | Status: AC
  Administered 2024-01-30: 2.4 10*6.[IU] via INTRAMUSCULAR

## 2024-01-30 MED ORDER — CARVEDILOL 6.25 MG PO TABS: 6.2500 mg | ORAL_TABLET | Freq: Two times a day (BID) | ORAL | 1 refills | Status: DC

## 2024-01-30 MED ORDER — VERAPAMIL HCL ER 240 MG PO TBCR: 240.0000 mg | EXTENDED_RELEASE_TABLET | Freq: Every day | ORAL | 0 refills | Status: DC

## 2024-01-30 MED ORDER — SPIRONOLACTONE 50 MG PO TABS: 50.0000 mg | ORAL_TABLET | Freq: Every day | ORAL | 0 refills | Status: DC

## 2024-01-30 NOTE — Progress Notes (Addendum)
  SUBJECTIVE:   CHIEF COMPLAINT / HPI:   Anne Gonzalez is a 48 year old female who presents with a rash on her hands and feet.  The rash began in February, coinciding with a COVID-19 infection, initially presenting as bumps with itching, especially at night. The itching has decreased, with only occasional mild itching now. Triamcinolone  cream has been used for over a month, improving the rash, while hydrocortisone  cream was ineffective. She has been picking at the rash, affecting its healing.  She is concerned about syphilis after researching her symptoms online. Her last sexual encounter was on September 13, 2023, and she has not been tested for sexually transmitted diseases since then. No vaginal discharge, irritation, or odor is present, and she continues to have regular menstrual periods.  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 She is self-conscious about the rash due to frequent handwashing.  PERTINENT  PMH / PSH: HS, TIA, HLD, HTN, OSA  OBJECTIVE:  BP (!) 178/98   Pulse 93   Wt 185 lb (83.9 kg)   SpO2 98%   BMI 32.77 kg/m  General: Well-appearing, NAD Pelvic exam: VULVA: normal appearing vulva with no masses, tenderness or lesions, VAGINA: vaginal discharge - white and thin, CERVIX: normal appearing cervix without discharge or lesions, exam chaperoned by Virgina Grills, CMA.  Skin: Bevin Bucks, hyperpigmented maculopapular rash on palmar surfaces of hands and feet         ASSESSMENT/PLAN:   Assessment & Plan Rash Very concerning for syphilis given clinical history and physical exam.  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 - IM penicillin G 2,400,000 units - Obtain STD testing - Follow-up exam and serologic testing with nontreponemal test at 6 and 12 months Essential hypertension BP: (!) 178/98 today. Poorly controlled secondary to  medication nonadherence. Goal of <140/90.  Thoroughly discussed necessity of taking medications, she has not been taking all of them since approximately November.  Increased risk for stroke and MI especially with previous history of TIA.   - Rx sent for Coreg  6.25 mg twice daily, verapamil  240 mg nightly, spironolactone  50 mg daily - Return in 2 weeks for follow-up Screening for cervical cancer Pap performed   Return in about 2 weeks (around 02/13/2024) for Hypertension follow-up. Veronia Goon, DO 01/30/2024, 4:31 PM PGY-3, [facility]

## 2024-01-30 NOTE — Patient Instructions (Addendum)
 It was great to see you today! Thank you for choosing [facility] for your primary care.  Today we addressed: I do suspect that rash is syphilis.  We are treating you preemptively but also have collected cervical swabs and labs for STDs.  We also performed a Pap today. High blood pressure: I need you to follow-up for this within 2 weeks.  It should not remain this high.  Please bring all of your medications to this visit for a formal med rec.  If you haven't already, sign up for My Chart to have easy access to your labs results, and communication with your primary care physician. We are checking some labs today. If they are abnormal, I will call you. If they are normal, I will send you a MyChart message (if it is active) or a letter in the mail. If you do not hear about your labs in the next 2 weeks, please call the office. Return in about 2 weeks (around 02/13/2024) for Hypertension follow-up. Please arrive 15 minutes before your appointment to ensure smooth check in process.  We appreciate your efforts in making this happen.  Thank you for allowing me to participate in your care, Veronia Goon, DO 01/30/2024, 4:11 PM PGY-3, [facility]

## 2024-02-01 LAB — HCV AB W REFLEX TO QUANT PCR: HCV Ab: NONREACTIVE

## 2024-02-01 LAB — RPR, QUANT+TP ABS (REFLEX): Rapid Plasma Reagin, Quant: 1:128 {titer} — ABNORMAL HIGH

## 2024-02-01 LAB — RPR: RPR Ser Ql: REACTIVE — AB

## 2024-02-01 LAB — HIV ANTIBODY (ROUTINE TESTING W REFLEX): HIV Screen 4th Generation wRfx: NONREACTIVE

## 2024-02-01 LAB — HCV INTERPRETATION

## 2024-02-01 LAB — HEPATITIS B SURFACE ANTIGEN: Hepatitis B Surface Ag: NEGATIVE

## 2024-02-03 ENCOUNTER — Encounter: Payer: Self-pay | Admitting: Neurology

## 2024-02-03 LAB — CERVICOVAGINAL ANCILLARY ONLY
Chlamydia: NEGATIVE
Comment: NEGATIVE
Comment: NEGATIVE
Comment: NORMAL
Neisseria Gonorrhea: NEGATIVE
Trichomonas: NEGATIVE

## 2024-02-03 LAB — CYTOLOGY - PAP
Comment: NEGATIVE
Diagnosis: NEGATIVE
High risk HPV: NEGATIVE

## 2024-02-04 ENCOUNTER — Telehealth: Payer: Self-pay

## 2024-02-04 NOTE — Telephone Encounter (Signed)
 Patient calls nurse line regarding positive syphilis results.   She is asking what the next steps are in treatment. She is also asking what can be done for treatment of the rash.   Patient received 2.4 million units penicillin  at last visit on 01/30/24.   Will forward to Dr. Dahbura for further advisement.   Elsie Halo, RN

## 2024-02-05 ENCOUNTER — Ambulatory Visit: Payer: Self-pay | Admitting: Student

## 2024-02-05 NOTE — Telephone Encounter (Signed)
 Attempted to call regarding lab results and patient wanted to know next steps.  Left HIPAA compliant voice message, I will note recommendations in MyChart message.

## 2024-08-13 ENCOUNTER — Emergency Department (HOSPITAL_BASED_OUTPATIENT_CLINIC_OR_DEPARTMENT_OTHER)
Admission: EM | Admit: 2024-08-13 | Discharge: 2024-08-13 | Discharge status: Against Medical Advice | Attending: Emergency Medicine | Admitting: Emergency Medicine

## 2024-08-13 ENCOUNTER — Other Ambulatory Visit: Payer: Self-pay

## 2024-08-13 ENCOUNTER — Ambulatory Visit: Admitting: Family Medicine

## 2024-08-13 VITALS — BP 170/120 | HR 81 | Temp 98.2°F | Ht 63.0 in | Wt 185.2 lb

## 2024-08-13 DIAGNOSIS — L02212 Cutaneous abscess of back [any part, except buttock]: Secondary | ICD-10-CM | POA: Diagnosis present

## 2024-08-13 DIAGNOSIS — A539 Syphilis, unspecified: Secondary | ICD-10-CM | POA: Diagnosis not present

## 2024-08-13 DIAGNOSIS — Z5321 Procedure and treatment not carried out due to patient leaving prior to being seen by health care provider: Secondary | ICD-10-CM | POA: Diagnosis not present

## 2024-08-13 DIAGNOSIS — I10 Essential (primary) hypertension: Secondary | ICD-10-CM | POA: Diagnosis not present

## 2024-08-13 DIAGNOSIS — L732 Hidradenitis suppurativa: Secondary | ICD-10-CM

## 2024-08-13 MED ORDER — CLINDAMYCIN PHOS (ONCE-DAILY) 1 % EX GEL: 1.0000 | CUTANEOUS | 3 refills | Status: DC | PRN

## 2024-08-13 MED ORDER — SULFAMETHOXAZOLE-TRIMETHOPRIM 400-80 MG PO TABS: 1.0000 | ORAL_TABLET | Freq: Two times a day (BID) | ORAL | 0 refills | Status: AC

## 2024-08-13 NOTE — Patient Instructions (Signed)
 It was wonderful to see you today.  Please bring ALL of your medications with you to every visit.   Today we talked about:  HS abscess - I believe you have an abscess, otherwise known as inflamed or infected collection. We have opened it up to let it drain more. I will also send an antibiotic. You can take tylenol  650 mg three times a day.   Blood pressure - Your blood pressure was very high today. We need to follow this up.   Please make a follow up appointment for Monday.   Thank you for choosing [facility].   Please call [phone] with any questions about today's appointment.  Please be sure to schedule follow up at the front desk before you leave today.   Areta Saliva, MD  Family Medicine

## 2024-08-13 NOTE — ED Triage Notes (Signed)
 Patient states HS flare up and has abscess to back on inner thigh.

## 2024-08-14 ENCOUNTER — Other Ambulatory Visit (HOSPITAL_COMMUNITY): Payer: Self-pay

## 2024-08-14 ENCOUNTER — Telehealth: Payer: Self-pay

## 2024-08-14 NOTE — Progress Notes (Signed)
 SUBJECTIVE:   CHIEF COMPLAINT / HPI:  Discussed the use of AI scribe software for clinical note transcription with the patient, who gave verbal consent to proceed.  History of Present Illness Anne Gonzalez is a 48 year old female with hidradenitis suppurativa who presents with a flare-up of her condition.  Cutaneous lesions and drainage - Recurring lesions for the past several weeks, primarily located in the groin area  - Current lesion in the groin area differs from previous lesions, previously mostly in axillae  - Groin lesion has ruptured, resulting in drainage onto underwear; however, continues to be very painful.  - Previously bactrim  effective for treatment of flares.  Has used clinda gel on azillae lesions with good efficacy, ran out and so could not use on this groin lesion  - Allergic to doxycycline thus is not on this for suppressive therapy due to reaction of skin peeling in multiple areas of her body.  - no fevers   HTN Patient says that she has taken all of her medications  No vision changes, or headaches  Says her BP is often high and in the 200s  Does not have time to discuss further today as daughter was driving her   Syphilis  Patient previously treated for syphilis and had lesions on hands and feet which have greatly improved since then.     PERTINENT  PMH / PSH: HS  OBJECTIVE:  BP (!) 170/120   Pulse 81   Temp 98.2 F (36.8 C) (Oral)   Ht 5' 3 (1.6 m)   Wt 185 lb 4 oz (84 kg)   LMP 07/16/2024   SpO2 99%   BMI 32.82 kg/m    General: well appearing, in no acute distress Resp: Normal work of breathing on room air Neuro: Alert & Oriented x 4  Derm:  Approximately 4 cm abscess with healed over opening. Very tender to touch in the right intergluteal cleft.  Multiple hyperpigmented macules on hands and feet bilaterally, improved from prior   ASSESSMENT/PLAN:   Assessment & Plan Hidradenitis suppurativa Current active flare in the groin.  Patient is Tressia stage 2. Unfortunately ran out of her clinda topical therapy. Cannot take doxycycline suppressive therapy due to allergy.  Procedure: Incision & drainage Informed consent:  Discussed risks and benefits of the procedure, as well as the alternatives.  Informed consent was obtained. Anesthesia: lidocaine  without epinephrine  The area was prepared and draped in a standard fashion. A small incision was made with an 11 blade and abscess was expressed with very little drainage. Area was dressed with a nonadherent pad.  The patient tolerated the procedure well. The patient was instructed on post-op care.  - Prescribed bactrim  for 7 days  - Refilled clinda gel  - Referral to dermatology for consideration of treatment with Humira  - If patient has another flare before dermatology appointment is available can consider rifampin therapy; though would prefer not to given the significant side effects.   Essential hypertension Uncontrolled. Patient did not want to discuss today due to time constraint. No signs of hypertensive emergency on exam.  - Follow up in one week with Dr. Orie  - Counseled on adherence to medications  - BMP at next visit  Syphilis Previously treated with penicillin  6 months ago  - Due for recheck now and in 6 months  - Did not have time at this appt will check RPR with Dr. Orie at next visit.     Areta Saliva, MD  [facility]

## 2024-08-17 ENCOUNTER — Ambulatory Visit

## 2024-08-17 VITALS — BP 150/90 | HR 85 | Ht 63.0 in | Wt 183.5 lb

## 2024-08-17 DIAGNOSIS — K219 Gastro-esophageal reflux disease without esophagitis: Secondary | ICD-10-CM

## 2024-08-17 DIAGNOSIS — A539 Syphilis, unspecified: Secondary | ICD-10-CM

## 2024-08-17 DIAGNOSIS — L732 Hidradenitis suppurativa: Secondary | ICD-10-CM | POA: Diagnosis not present

## 2024-08-17 DIAGNOSIS — F4321 Adjustment disorder with depressed mood: Secondary | ICD-10-CM

## 2024-08-17 DIAGNOSIS — I10 Essential (primary) hypertension: Secondary | ICD-10-CM

## 2024-08-17 MED ORDER — OMEPRAZOLE 20 MG PO CPDR: 20.0000 mg | DELAYED_RELEASE_CAPSULE | Freq: Every day | ORAL | 2 refills | Status: AC

## 2024-08-17 MED ORDER — VERAPAMIL HCL ER 240 MG PO TBCR: 240.0000 mg | EXTENDED_RELEASE_TABLET | Freq: Every day | ORAL | 0 refills | Status: AC

## 2024-08-17 MED ORDER — NURTEC 75 MG PO TBDP: 75.0000 mg | ORAL_TABLET | Freq: Every day | ORAL | 6 refills | Status: AC | PRN

## 2024-08-17 MED ORDER — SPIRONOLACTONE 50 MG PO TABS: 50.0000 mg | ORAL_TABLET | Freq: Every day | ORAL | 0 refills | Status: AC

## 2024-08-17 MED ORDER — SERTRALINE HCL 50 MG PO TABS: 50.0000 mg | ORAL_TABLET | Freq: Every day | ORAL | 0 refills | Status: AC

## 2024-08-17 NOTE — Progress Notes (Signed)
    SUBJECTIVE:   CHIEF COMPLAINT / HPI:   HS - seen last week for I&D - Has started bactrim . No fevers, NVD. Has not been draining much but feels improved - Triggered by fatty foods and tight clothes   HTN BP Readings from Last 6 Encounters:  08/17/24 (!) 150/90  08/13/24 (!) 170/120  08/13/24 (!) 198/120  01/30/24 (!) 178/98  01/02/24 (!) 157/107  12/10/23 (!) 153/111   - coreg  6.25mg  BID, aldactone  50mg , verapamil  240mg  on her med list. She only remembers taking verapamil , but she has been out.  - Forgets at least 3 times per week. Admits she has hard time remembering meds.  - of note, she has allergies to CCB (swelling), thiazide(swelling), and cough with ACE  Hx Syphilis - treated appx 6 months ago - no current symptoms  PERTINENT  PMH / PSH: HTN  OBJECTIVE:   BP (!) 150/90   Pulse 85   Ht 5' 3 (1.6 m)   Wt 183 lb 8 oz (83.2 kg)   LMP 07/16/2024   SpO2 98%   BMI 32.51 kg/m   Physical Exam General: Alert, conversant, cooperative. No acute distress.  HEENT: PERRL. EOMI. MMM.  Cardiovascular: RRR Respiratory: Lungs CTAB. Normal work of breathing. Abdomen: Non distended Extremities: No cyanosis. No edema Musculoskeletal: No gross deformities.  Skin: Warm. Dry. No rashes. No icterus.  Neurologic: No focal deficits. Moving all extremities. Psychiatric: Cooperative. Appropriate mood. Appropriate affect.    ASSESSMENT/PLAN:   Assessment & Plan Essential hypertension Upon discussion, pt not taking meds regularly, and is only taking verapamil . Discussed ways to improve adherence, such as putting meds next to toothbrush or setting phone alarm. Refilled meds today. Discontinued coreg  as she is not taking.  Adjustment reaction with brief depressive reaction Refilled zoloft , doing well, no concerns Gastroesophageal reflux disease, unspecified whether esophagitis present Refilled prilosec, doing well Syphilis Positive test and treatment appx 6 months ago. Repeat  titers ordered today to confirm clearance.  Hidradenitis Improving acute lesion. Continue with abx and derm FU     Milda LITTIE Deed, MD [facility]

## 2024-08-17 NOTE — Assessment & Plan Note (Signed)
 Improving acute lesion. Continue with abx and derm FU

## 2024-08-17 NOTE — Patient Instructions (Signed)
 It was good to see you today.   Please bring ALL of your medications with you to every visit.    Today we talked about: Blood Pressure. Make an alert on phone and keep BP meds next to toothbrush.     Thank you for choosing [facility]. Please refer to your mychart for specifics regarding today's visit or future appointments.

## 2024-08-18 ENCOUNTER — Ambulatory Visit: Payer: Self-pay

## 2024-08-18 NOTE — Telephone Encounter (Signed)
 Attempted to submit PA via Latent.   Per insurance: [employer] does not manage PA for this patient. Please contact the number on the back of the members card for further assistance   Will contact plan.

## 2024-08-19 LAB — SYPHILIS: RPR W/REFLEX TO RPR TITER AND TREPONEMAL ANTIBODIES, TRADITIONAL SCREENING AND DIAGNOSIS ALGORITHM: RPR Ser Ql: REACTIVE — AB

## 2024-08-19 LAB — RPR, QUANT+TP ABS (REFLEX)
Rapid Plasma Reagin, Quant: 1:8 {titer} — ABNORMAL HIGH
T Pallidum Abs: REACTIVE — AB

## 2024-08-21 ENCOUNTER — Other Ambulatory Visit (HOSPITAL_COMMUNITY): Payer: Self-pay

## 2024-08-21 NOTE — Telephone Encounter (Signed)
 Per test claim:   Clindamycin  1% gel (30 gram tube) is covered for $5 copay.   Please resend for the clindamycin  phosphate (twice-daily) 1% ext. Gel if applicable [facility] [phone] for pharmacy to use if needed).

## 2024-09-03 ENCOUNTER — Other Ambulatory Visit: Payer: Self-pay | Admitting: Family Medicine

## 2024-09-09 ENCOUNTER — Telehealth: Admitting: Neurology

## 2024-09-09 DIAGNOSIS — G4733 Obstructive sleep apnea (adult) (pediatric): Secondary | ICD-10-CM

## 2024-09-09 NOTE — Progress Notes (Signed)
 Patient: Anne Gonzalez Date of Birth: 10-25-1975  Reason for Visit: Follow up for OSA History from: Patient Primary Neurologist: Buck   Virtual Visit via Video Note  I connected with Anne Gonzalez on 09/09/2024 at  4:00 PM EST by a video enabled telemedicine application and verified that I am speaking with the correct person using two identifiers.  Location: Patient: at her home  Provider: in the office    I discussed the limitations of evaluation and management by telemedicine and the availability of in person appointments. The patient expressed understanding and agreed to proceed.  ASSESSMENT AND PLAN 48 y.o. year old female   1.  Severe OSA on CPAP (HST 09/30/23 showed severe OSA with total AHI of 54.8/hour and O2 nadir of 71%.Setup date 10/15/23)  Currently not using CPAP.  But is experiencing symptoms of severe OSA.  She is motivated to resume CPAP.  Discussed the importance of using nightly minimum 4 hours.  If she has any specific issues encouraged her to reach out to me.  We will plan to follow-up in 6 months virtually  HISTORY OF PRESENT ILLNESS: Today 09/09/2024 09/09/24 SS: VV, here for CPAP. She is not using her CPAP. Cannot get comfortable, finds herself getting wrapped up in the hose. Last time we lowered her pressure to help with sore throat, which was better. She is feeling tired, doze off easily, kids report noises in her sleep.  Motivated to resume CPAP.  She has nasal pillow and fullface mask.  01/02/24 SS: Saw Dr. Buck November 2024 for for sleep consult with prior diagnoses of OSA with CPAP use. HST 09/30/23 showed severe OSA with total AHI of 54.8/hour and O2 nadir of 71%.Setup date 10/15/23. Data below in initial 30 days is below with better compliance 78%,  > 4 hours 63%, AHI 0.7, leak 8.3. More recent data of the last 30 days show poor compliance at 13%.  Unfortunately, she developed a sore throat 10/29/23 continued for several weeks.  She then tested  positive for COVID.  Since then she has not used CPAP.  Has been fearful it would result in a sore throat.  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  She was given Tylenol  3 for the sore throat.  She is motivated to resume CPAP.  She initially had a fullface mask (caused facial rash) , then went back to her DME and now has a nasal pillow mask.  She has increased the humidity.ESS 13. She is feeling tired, not sleeping well, motivated to use CPAP.         HISTORY  08/20/23 Dr. Buck: I saw your patient, Anne Gonzalez, upon your kind request in my sleep clinic today for initial consultation of her sleep disorder, in particular, evaluation of her prior diagnosis of obstructive sleep apnea.  The patient is unaccompanied today.  As you know, Anne Gonzalez is a 48 year old female with an underlying medical history of TIA, hypertensive emergency, smoking, reflux disease, allergies, migraine headaches, status post partial thyroidectomy, and obesity, who was previously diagnosed with obstructive sleep apnea and placed on PAP therapy.  Her Epworth sleepiness score is 14 out of 24, fatigue severity score is 23 out of 63.  I reviewed your office note from 07/19/2023.  [place] when she was still residing in [place] .  She recalls that she had mild sleep apnea at the time but does report that her symptoms have become worse.  She reports loud snoring and daytime somnolence as well as witnessed apneas and significant weight gain within the past 7 to 8 months in the realm of 20 pounds.  She lives with her 2 younger children who are teenagers.  She has 2 grown children as well.  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  She works from Monday through Friday, 8-4.  Bedtime is variable but generally around 10:30 PM and rise time around 6:30 AM.  She has nocturia about 3 times per average night and has had occasional morning headaches.  She limits her caffeine to 1 to 2 cups  of soda per day.  She drinks alcohol occasionally.  She is trying to quit smoking but still smokes about half a pack per day or less.  Prior sleep study results are not available for my review today.  She no longer has a CPAP machine and has not used a PAP machine in years.  She would be willing to get retested an restart PAP therapy.   REVIEW OF SYSTEMS: Out of a complete 14 system review of symptoms, the patient complains only of the following symptoms, and all other reviewed systems are negative.  See HPI  ALLERGIES: Allergies  Allergen Reactions   Aspirin  Other (See Comments)    Upset Stomach    Pineapple Other (See Comments)    Mouth swells, occurred only after pregnancy   Amlodipine  Swelling    Issues with swelling even at 5mg  dose   Hctz [Hydrochlorothiazide ] Swelling and Other (See Comments)    Causes swelling and headaches    Lisinopril Cough   Doxycycline Rash    HOME MEDICATIONS: Outpatient Medications Prior to Visit  Medication Sig Dispense Refill   aspirin  EC 81 MG tablet Take 1 tablet (81 mg total) by mouth daily. Swallow whole.     omeprazole  (PRILOSEC) 20 MG capsule Take 1 capsule (20 mg total) by mouth daily. 30 capsule 2   Rimegepant Sulfate (NURTEC) 75 MG TBDP Take 1 tablet (75 mg total) by mouth daily as needed. 8 tablet 6   sertraline  (ZOLOFT ) 50 MG tablet Take 1 tablet (50 mg total) by mouth daily. 90 tablet 0   spironolactone  (ALDACTONE ) 50 MG tablet Take 1 tablet (50 mg total) by mouth daily. 90 tablet 0   sulfamethoxazole -trimethoprim  (BACTRIM ) 400-80 MG tablet Take 1 tablet by mouth 2 (two) times daily. 14 tablet 0   topiramate  (TOPAMAX ) 50 MG tablet Take 1 tablet (50 mg total) by mouth 2 (two) times daily. 60 tablet 12   verapamil  (CALAN -SR) 240 MG CR tablet Take 1 tablet (240 mg total) by mouth at bedtime. 90 tablet 0   No facility-administered medications prior to visit.    PAST MEDICAL HISTORY: Past Medical History:  Diagnosis Date   Abnormal  menstruation 08/04/2020   Acid reflux 2005   Environmental allergies 2019   Hypertension    Irregular heart beat 2019   Lumbar pain with radiation down left leg 04/13/2021   Migraine 2019   Sleep apnea 2019    PAST SURGICAL HISTORY: Past Surgical History:  Procedure Laterality Date   THYROID  SURGERY  2012   TUBAL LIGATION  2009    FAMILY HISTORY: Family History  Problem Relation Age of Onset   Diabetes Father    Hypertension Father    Heart failure Father    Alcohol abuse Father    Drug abuse Father    Depression Father    Hyperlipidemia Father    Sickle  cell anemia Sister     SOCIAL HISTORY: Social History   Socioeconomic History   Marital status: Divorced    Spouse name: Not on file   Number of children: Not on file   Years of education: Not on file   Highest education level: Not on file  Occupational History   Not on file  Tobacco Use   Smoking status: Every Day    Types: Cigarettes    Start date: 18    Passive exposure: Current   Smokeless tobacco: Never  Substance and Sexual Activity   Alcohol use: Yes    Comment: occasional wine   Drug use: No   Sexual activity: Not on file  Other Topics Concern   Not on file  Social History Narrative   Not on file   Social Drivers of Health   Tobacco Use: High Risk (08/17/2024)   Patient History    Smoking Tobacco Use: Every Day    Smokeless Tobacco Use: Never    Passive Exposure: Current  Financial Resource Strain: Not on file  Food Insecurity: Not on file  Transportation Needs: Not on file  Physical Activity: Not on file  Stress: Not on file  Social Connections: Not on file  Intimate Partner Violence: Not on file  Depression (PHQ2-9): Low Risk (08/17/2024)   Depression (PHQ2-9)    PHQ-2 Score: 0  Alcohol Screen: Not on file  Housing: Not on file  Utilities: Not on file  Health Literacy: Not on file    PHYSICAL EXAM  Virtual visit  DIAGNOSTIC DATA (LABS, IMAGING, TESTING) - I reviewed  patient records, labs, notes, testing and imaging myself where available.  Lab Results  Component Value Date   WBC 9.0 05/01/2023   HGB 12.9 05/01/2023   HCT 38.0 05/01/2023   MCV 96.4 05/01/2023   PLT 373 05/01/2023      Component Value Date/Time   NA 138 05/01/2023 1036   NA 141 12/14/2022 1220   K 4.6 05/01/2023 1036   CL 105 05/01/2023 1036   CO2 21 (L) 05/01/2023 1034   GLUCOSE 224 (H) 05/01/2023 1036   BUN 16 05/01/2023 1036   BUN 15 12/14/2022 1220   CREATININE 1.00 05/01/2023 1036   CALCIUM  9.2 05/01/2023 1034   PROT 7.1 05/01/2023 1034   PROT 6.8 08/03/2020 1121   ALBUMIN 3.4 (L) 05/01/2023 1034   ALBUMIN 4.2 08/03/2020 1121   AST 29 05/01/2023 1034   ALT 24 05/01/2023 1034   ALKPHOS 64 05/01/2023 1034   BILITOT 0.6 05/01/2023 1034   BILITOT 0.3 08/03/2020 1121   GFRNONAA >60 05/01/2023 1034   GFRAA 89 08/03/2020 1121   Lab Results  Component Value Date   CHOL 258 (H) 01/19/2022   HDL 66 01/19/2022   LDLCALC 175 (H) 01/19/2022   TRIG 96 01/19/2022   CHOLHDL 3.9 01/19/2022   Lab Results  Component Value Date   HGBA1C 5.5 05/13/2023   Lab Results  Component Value Date   VITAMINB12 393 04/11/2018   Lab Results  Component Value Date   TSH 1.430 08/03/2020    Lauraine Born, AGNP-C, DNP 09/09/2024, 4:04 PM [facility] [address] [place], [place] [address] [phone]

## 2024-09-09 NOTE — Patient Instructions (Signed)
 Recommend resume CPAP use.  Recommend nightly usage minimum 4 hours.  If any specific issues reach out.  I will be happy to review the data in a couple weeks.  Feel free to reach out via MyChart.  Otherwise follow-up 6 months.  Thanks!!

## 2024-10-23 ENCOUNTER — Ambulatory Visit: Admitting: Family Medicine

## 2024-10-23 ENCOUNTER — Other Ambulatory Visit (HOSPITAL_COMMUNITY)
Admission: RE | Admit: 2024-10-23 | Discharge: 2024-10-23 | Disposition: A | Source: Ambulatory Visit | Attending: Family Medicine | Admitting: Family Medicine

## 2024-10-23 VITALS — BP 208/124 | HR 90 | Ht 63.0 in | Wt 189.0 lb

## 2024-10-23 DIAGNOSIS — I1A0 Resistant hypertension: Secondary | ICD-10-CM | POA: Diagnosis not present

## 2024-10-23 DIAGNOSIS — Z113 Encounter for screening for infections with a predominantly sexual mode of transmission: Secondary | ICD-10-CM | POA: Diagnosis present

## 2024-10-23 LAB — CERVICOVAGINAL ANCILLARY ONLY
Bacterial Vaginitis (gardnerella): POSITIVE — AB
Candida Glabrata: NEGATIVE
Candida Vaginitis: NEGATIVE
Chlamydia: NEGATIVE
Comment: NEGATIVE
Comment: NEGATIVE
Comment: NEGATIVE
Comment: NEGATIVE
Comment: NEGATIVE
Comment: NORMAL
Neisseria Gonorrhea: NEGATIVE
Trichomonas: NEGATIVE

## 2024-10-23 LAB — POCT WET PREP (WET MOUNT)
Clue Cells Wet Prep Whiff POC: POSITIVE
Trichomonas Wet Prep HPF POC: ABSENT

## 2024-10-23 NOTE — Assessment & Plan Note (Signed)
-   patient instructed to take her medications as soon as she returns home, and to schedule urgent follow up with PCP to adjust medications for control of hypertension

## 2024-10-23 NOTE — Progress Notes (Signed)
" ° ° °  SUBJECTIVE:   CHIEF COMPLAINT / HPI:   STI check - patient denies any current symptoms - partner with yellow/green discharge today - prior history of syphilis infection, appropriately with corresponding reductions in quantities.   Uncontrolled Hypertension - BP severely elevated in office today - has not taken her medications  - denies headache, vision changes, chest pain at this time.   PERTINENT  PMH / PSH: Resistant hypertension, prior syphilis infection  OBJECTIVE:   BP (!) 208/124   Pulse 90   Ht 5' 3 (1.6 m)   Wt 189 lb (85.7 kg)   SpO2 100%   BMI 33.48 kg/m   GU: Nelson Pate present as chaperone. Normal appearing skin of the vulva and vaginal introitus. No swelling, erythema or discharge from the cervix. No abnormal discharge noted in the vaginal vault.   ASSESSMENT/PLAN:   Assessment & Plan Routine screening for STI (sexually transmitted infection) -GC/Chlamydia, BV, yeast, Trichomonas swabs collected in office - RPR and HIV testing ordered - will treat as appropriate when testing is resulted.  Resistant hypertension - patient instructed to take her medications as soon as she returns home, and to schedule urgent follow up with PCP to adjust medications for control of hypertension   Lucie Pinal, DO [facility] "

## 2024-10-23 NOTE — Patient Instructions (Signed)
 It was wonderful to see you today!  Today we checked you for GC/Chlamydia, BV, trichomonas, yeast, syphilis and HIV. If any of your testing comes back positive, I will call you to discuss next steps.   Your blood pressure was also very elevated today. This can be due to stress, but I would recommend following up with your PCP to talk about any changes you may need to make to your medications.    Please call [phone] with any questions about today's appointment.   If you need any additional refills, please call your pharmacy before calling the office.  Lucie Pinal, DO Family Medicine

## 2024-10-26 ENCOUNTER — Ambulatory Visit: Payer: Self-pay | Admitting: Family Medicine

## 2024-10-26 DIAGNOSIS — B9689 Other specified bacterial agents as the cause of diseases classified elsewhere: Secondary | ICD-10-CM

## 2024-10-26 MED ORDER — METRONIDAZOLE 0.75 % VA GEL: 1.0000 | Freq: Every day | VAGINAL | 0 refills | Status: AC

## 2024-10-27 LAB — RPR, QUANT+TP ABS (REFLEX)
Rapid Plasma Reagin, Quant: 1:4 {titer} — ABNORMAL HIGH
T Pallidum Abs: REACTIVE — AB

## 2024-10-27 LAB — SYPHILIS: RPR W/REFLEX TO RPR TITER AND TREPONEMAL ANTIBODIES, TRADITIONAL SCREENING AND DIAGNOSIS ALGORITHM: RPR Ser Ql: REACTIVE — AB

## 2024-10-27 LAB — HIV ANTIBODY (ROUTINE TESTING W REFLEX): HIV Screen 4th Generation wRfx: NONREACTIVE

## 2025-03-17 ENCOUNTER — Telehealth: Admitting: Neurology

## 2025-03-31 ENCOUNTER — Ambulatory Visit: Admitting: Physician Assistant
